# Patient Record
Sex: Female | Born: 1965 | Race: White | Hispanic: Yes | Marital: Married | Smoking: Never smoker
Health system: Southern US, Community
[De-identification: ages and names within clinical notes are randomized; demographics above are authoritative.]

## PROBLEM LIST (undated history)

## (undated) DIAGNOSIS — I10 Essential (primary) hypertension: Secondary | ICD-10-CM

## (undated) HISTORY — DX: Essential (primary) hypertension: I10

---

## 2001-05-20 ENCOUNTER — Inpatient Hospital Stay (HOSPITAL_COMMUNITY): Admission: AD | Admit: 2001-05-20 | Discharge: 2001-05-20 | Payer: Self-pay | Admitting: *Deleted

## 2001-05-24 ENCOUNTER — Encounter: Admission: RE | Admit: 2001-05-24 | Discharge: 2001-05-24 | Payer: Self-pay | Admitting: Obstetrics & Gynecology

## 2001-06-28 ENCOUNTER — Encounter: Admission: RE | Admit: 2001-06-28 | Discharge: 2001-06-28 | Payer: Self-pay | Admitting: Obstetrics & Gynecology

## 2002-04-06 ENCOUNTER — Encounter: Payer: Self-pay | Admitting: Internal Medicine

## 2002-04-06 ENCOUNTER — Ambulatory Visit (HOSPITAL_COMMUNITY): Admission: RE | Admit: 2002-04-06 | Discharge: 2002-04-06 | Payer: Self-pay | Admitting: Internal Medicine

## 2002-05-01 ENCOUNTER — Encounter: Payer: Self-pay | Admitting: Specialist

## 2002-05-01 ENCOUNTER — Encounter: Admission: RE | Admit: 2002-05-01 | Discharge: 2002-05-01 | Payer: Self-pay | Admitting: Specialist

## 2002-05-22 ENCOUNTER — Encounter (HOSPITAL_COMMUNITY): Admission: RE | Admit: 2002-05-22 | Discharge: 2002-08-20 | Payer: Self-pay | Admitting: Family Medicine

## 2003-08-22 ENCOUNTER — Ambulatory Visit (HOSPITAL_COMMUNITY): Admission: RE | Admit: 2003-08-22 | Discharge: 2003-08-22 | Payer: Self-pay | Admitting: *Deleted

## 2003-12-18 ENCOUNTER — Inpatient Hospital Stay (HOSPITAL_COMMUNITY): Admission: AD | Admit: 2003-12-18 | Discharge: 2003-12-20 | Payer: Self-pay | Admitting: *Deleted

## 2005-05-17 ENCOUNTER — Emergency Department (HOSPITAL_COMMUNITY): Admission: EM | Admit: 2005-05-17 | Discharge: 2005-05-17 | Payer: Self-pay | Admitting: Emergency Medicine

## 2008-05-01 ENCOUNTER — Ambulatory Visit: Payer: Self-pay | Admitting: Internal Medicine

## 2008-05-01 ENCOUNTER — Ambulatory Visit (HOSPITAL_COMMUNITY): Admission: RE | Admit: 2008-05-01 | Discharge: 2008-05-01 | Payer: Self-pay | Admitting: Internal Medicine

## 2008-05-01 LAB — CONVERTED CEMR LAB
ALT: 21 units/L (ref 0–35)
BUN: 14 mg/dL (ref 6–23)
CO2: 20 meq/L (ref 19–32)
Calcium: 8.4 mg/dL (ref 8.4–10.5)
Chloride: 105 meq/L (ref 96–112)
Cholesterol: 136 mg/dL (ref 0–200)
Creatinine, Ser: 0.55 mg/dL (ref 0.40–1.20)
Eosinophils Relative: 5 % (ref 0–5)
Glucose, Bld: 85 mg/dL (ref 70–99)
HCT: 27.6 % — ABNORMAL LOW (ref 36.0–46.0)
HDL: 45 mg/dL (ref >39)
Hemoglobin: 7.7 g/dL — CL (ref 12.0–15.0)
Lymphocytes Relative: 23 % (ref 12–46)
Lymphs Abs: 1.4 10*3/uL (ref 0.7–4.0)
Monocytes Absolute: 0.4 10*3/uL (ref 0.1–1.0)
Monocytes Relative: 6 % (ref 3–12)
Neutro Abs: 4 10*3/uL (ref 1.7–7.7)
RBC: 4.09 M/uL (ref 3.87–5.11)
Total Bilirubin: 0.4 mg/dL (ref 0.3–1.2)
Total CHOL/HDL Ratio: 3
Triglycerides: 140 mg/dL (ref <150)

## 2008-05-08 ENCOUNTER — Ambulatory Visit (HOSPITAL_COMMUNITY): Admission: RE | Admit: 2008-05-08 | Discharge: 2008-05-08 | Payer: Self-pay | Admitting: Family Medicine

## 2008-05-30 ENCOUNTER — Ambulatory Visit: Payer: Self-pay | Admitting: Internal Medicine

## 2008-05-30 ENCOUNTER — Ambulatory Visit: Payer: Self-pay | Admitting: *Deleted

## 2008-06-21 ENCOUNTER — Ambulatory Visit: Payer: Self-pay | Admitting: Internal Medicine

## 2008-06-21 ENCOUNTER — Encounter: Payer: Self-pay | Admitting: Internal Medicine

## 2008-06-21 LAB — CONVERTED CEMR LAB
Chlamydia, DNA Probe: NEGATIVE
GC Probe Amp, Genital: NEGATIVE

## 2008-07-05 ENCOUNTER — Ambulatory Visit: Payer: Self-pay | Admitting: Internal Medicine

## 2008-07-05 LAB — CONVERTED CEMR LAB
Basophils Absolute: 0 10*3/uL (ref 0.0–0.1)
Eosinophils Absolute: 0.2 10*3/uL (ref 0.0–0.7)
Eosinophils Relative: 3 % (ref 0–5)
HCT: 35.9 % — ABNORMAL LOW (ref 36.0–46.0)
Hemoglobin: 11.7 g/dL — ABNORMAL LOW (ref 12.0–15.0)
Lymphocytes Relative: 29 % (ref 12–46)
MCHC: 32.6 g/dL (ref 30.0–36.0)
Monocytes Absolute: 0.4 10*3/uL (ref 0.1–1.0)
RBC: 4.46 M/uL (ref 3.87–5.11)

## 2009-05-10 ENCOUNTER — Ambulatory Visit (HOSPITAL_COMMUNITY): Admission: RE | Admit: 2009-05-10 | Discharge: 2009-05-10 | Payer: Self-pay | Admitting: Internal Medicine

## 2012-03-22 ENCOUNTER — Other Ambulatory Visit (HOSPITAL_COMMUNITY): Payer: Self-pay | Admitting: Physician Assistant

## 2012-03-22 DIAGNOSIS — Z1231 Encounter for screening mammogram for malignant neoplasm of breast: Secondary | ICD-10-CM

## 2012-04-26 ENCOUNTER — Ambulatory Visit (HOSPITAL_COMMUNITY)
Admission: RE | Admit: 2012-04-26 | Discharge: 2012-04-26 | Disposition: A | Discharge status: Home / Self Care | Payer: Self-pay | Source: Ambulatory Visit | Attending: Physician Assistant | Admitting: Physician Assistant

## 2012-04-26 DIAGNOSIS — Z1231 Encounter for screening mammogram for malignant neoplasm of breast: Secondary | ICD-10-CM

## 2012-05-02 ENCOUNTER — Other Ambulatory Visit: Payer: Self-pay | Admitting: Physician Assistant

## 2012-05-02 DIAGNOSIS — R928 Other abnormal and inconclusive findings on diagnostic imaging of breast: Secondary | ICD-10-CM

## 2012-06-24 ENCOUNTER — Other Ambulatory Visit: Payer: Self-pay | Admitting: Family Medicine

## 2012-06-24 DIAGNOSIS — R928 Other abnormal and inconclusive findings on diagnostic imaging of breast: Secondary | ICD-10-CM

## 2012-06-27 ENCOUNTER — Ambulatory Visit
Admission: RE | Admit: 2012-06-27 | Discharge: 2012-06-27 | Disposition: A | Discharge status: Home / Self Care | Payer: No Typology Code available for payment source | Source: Ambulatory Visit | Attending: Physician Assistant | Admitting: Physician Assistant

## 2012-06-27 DIAGNOSIS — R928 Other abnormal and inconclusive findings on diagnostic imaging of breast: Secondary | ICD-10-CM

## 2013-04-05 ENCOUNTER — Other Ambulatory Visit: Payer: Self-pay | Admitting: Obstetrics and Gynecology

## 2013-04-05 DIAGNOSIS — Z1231 Encounter for screening mammogram for malignant neoplasm of breast: Secondary | ICD-10-CM

## 2013-05-01 ENCOUNTER — Ambulatory Visit (HOSPITAL_COMMUNITY)
Admission: RE | Admit: 2013-05-01 | Discharge: 2013-05-01 | Disposition: A | Discharge status: Home / Self Care | Payer: Self-pay | Source: Ambulatory Visit | Attending: Obstetrics and Gynecology | Admitting: Obstetrics and Gynecology

## 2013-05-01 DIAGNOSIS — Z1231 Encounter for screening mammogram for malignant neoplasm of breast: Secondary | ICD-10-CM | POA: Insufficient documentation

## 2013-10-19 ENCOUNTER — Telehealth (HOSPITAL_COMMUNITY): Payer: Self-pay | Admitting: *Deleted

## 2013-10-19 NOTE — Telephone Encounter (Signed)
Telephoned patient at home #. Patient states no longer having breast pain. Advised patient the importance of doing monthly breast exams and if notice any lumps or changes to call. Also needs to avoid any caffeine because this can cause breast pain. Patient voiced understanding. Used interpreter Delorise Royals.

## 2014-04-12 ENCOUNTER — Other Ambulatory Visit (HOSPITAL_COMMUNITY): Payer: Self-pay | Admitting: Nurse Practitioner

## 2014-04-12 DIAGNOSIS — Z1231 Encounter for screening mammogram for malignant neoplasm of breast: Secondary | ICD-10-CM

## 2014-05-02 ENCOUNTER — Ambulatory Visit (HOSPITAL_COMMUNITY): Payer: No Typology Code available for payment source

## 2014-05-02 ENCOUNTER — Ambulatory Visit (HOSPITAL_COMMUNITY)
Admission: RE | Admit: 2014-05-02 | Discharge: 2014-05-02 | Disposition: A | Discharge status: Home / Self Care | Payer: No Typology Code available for payment source | Source: Ambulatory Visit | Attending: Nurse Practitioner | Admitting: Nurse Practitioner

## 2014-05-02 DIAGNOSIS — Z1231 Encounter for screening mammogram for malignant neoplasm of breast: Secondary | ICD-10-CM

## 2014-05-30 ENCOUNTER — Encounter: Payer: Self-pay | Admitting: Obstetrics & Gynecology

## 2014-06-28 ENCOUNTER — Encounter: Payer: Self-pay | Admitting: *Deleted

## 2014-07-05 ENCOUNTER — Encounter: Payer: No Typology Code available for payment source | Admitting: Obstetrics & Gynecology

## 2014-07-30 ENCOUNTER — Ambulatory Visit (INDEPENDENT_AMBULATORY_CARE_PROVIDER_SITE_OTHER): Payer: Self-pay | Admitting: Obstetrics & Gynecology

## 2014-07-30 ENCOUNTER — Encounter: Payer: Self-pay | Admitting: Obstetrics & Gynecology

## 2014-07-30 VITALS — BP 120/71 | HR 83 | Temp 98.8°F | Ht <= 58 in | Wt 129.2 lb

## 2014-07-30 DIAGNOSIS — N912 Amenorrhea, unspecified: Secondary | ICD-10-CM

## 2014-07-30 NOTE — Patient Instructions (Signed)
Menopausia y productos a base de hierbas  (Menopause and Herbal Products) La menopausia es el perodo normal de la vida en el que los perodos menstruales cesan por completo. Se completa cuando no se tienen 12 perodos menstruales consecutivos. Por lo general ocurre [employer] 48 a 55 aos, con una edad media de [place]. Muy rara vez una mujer tiene la menopausia antes de los 40 [place]. En la menopausia, los ovarios dejan de producir hormonas femeninas, estrgenos y Education officer, museum. Esto puede causar sntomas indeseables y Sport and exercise psychologist a Radiographer, therapeutic. A veces los sntomas pueden ocurrir de 4 a 5 aos antes de que [employer]. No existe una relacin Parker Hann[employer]in y:   [place] anticonceptivos orales.  La cantidad de hijos que tuvo.  La raza.  La edad en que comenzaron los perodos menstruales (menarca). Las mujeres que fuman mucho y las que son muy delgadas pueden desarrollar la menopausia en edades menores.  El tratamiento hormonal con estrgenos y progesterona es el mtodo usual de tratamiento para los sntomas menopusicos. Sin embargo, hay mujeres que no deben recibir Scientist, research (medical) hormonal. Los casos son:   Las mujeres que sufren cncer de mama o de tero.  Las que prefieren no tomar hormonas debido a Neurosurgeon secundarios (sangrado uterino anormal).  Las que tienen miedo de que las hormonas puedan causarle cncer de mama.  Las mujeres que tienen antecedentes de enfermedad heptica, enfermedad cardaca, ictus o cogulos de sangre. Para estas mujeres, hay otros medicamentos que pueden ayudar a tratar los sntomas de la [place]. Estos medicamentos se encuentran en las plantas y en los productos botnicos. Se pueden encontrar en forma de hierbas, ts, aceites, tinturas, y pldoras.  CAUSAS   Los ovarios dejan de producir las hormonas femeninas, estrgenos y Education officer, museum.  Otras causas son:  Kandis Ban para extirpar ambos ovarios.  Los ovarios que dejan de funcionar sin un  motivo conocido.  Tumores de la glndula pituitaria en el cerebro.  Enfermedades que [employer] ovarios y la produccin de hormonas.  La radioterapia en el abdomen o la pelvis.  La quimioterapia que afecte a los ovarios. FITOESTRGENOS Los fitoestrgenos se encuentran de forma natural en las plantas y en los productos vegetales. Actan como el estrgeno en el cuerpo. Los medicamentos de hierbas estn hechos de estas plantas y de esteroides botnicos. Hay 3 tipos de fitoestrgenos:   Las isoflavonas (genistena y daidzena) se encuentran en los alimentos que contienen soja, garbanzos, miso y tofu.  Los ligins se encuentran en las cscaras de las semillas. Se utilizan para hacer aceites como el aceite de linaza. Las bacterias en el intestino actan sobre estos alimentos para producir el estrgeno, como las hormonas.  El coumestan se asemeja a los estrgenos. Algunos de los alimentos se encuentran en las semillas de [place]aken[place] y los brotes de soja. TRASTORNOS Y SU POSIBLE TRATAMIENTO CON HIERBAS  Sofocones y sudores nocturnos.  Reinaldo Berber, cohosh negro y [place].  Irritabilidad, insomnio, depresin y [address] de [place].  [place], ginseng y soja.  La hierba de [place] puede ser til para la depresin. Sin embargo, Catering manager preocupacin de que produzca cataratas en los ojos y puede tener efectos negativos en otros medicamentos. La hierba de [place]. [place] no debe tomarse durante mucho tiempo y sin la aprobacin de su mdico.  La prdida de la libido y la sequedad vaginal y de la piel.  ame silvestre y soja.  La prevencin de la enfermedad cardaca coronaria y de la osteoporosis.  Reinaldo Berber  e isoflavonas. Varios estudios han demostrado que algunas mujeres se benefician con los medicamentos a base de hierbas, pero la mayora de los estudios no han demostrado que estos suplementos sean mucho mejores que el placebo. Otras formas de tratamiento para ayudar a las mujeres con sntomas de menopausia son  Marnee Guarneriuna dieta equilibrada, descanso, ejercicio, vitaminas y suplemento de calcio Biomedical engineer(con vitamina D), acupuntura y terapia de grupo cuando sea necesario.  QUIENES NO DEBEN TOMAR MEDICAMENTOS DE HIERBAS:   Las mujeres que estn pensando en quedar embarazadas a menos que su mdico le indique.  Las mujeres que estn amamantando a menos que su mdico le indique.  Las mujeres que estn tomando otros medicamentos recetados a menos que su mdico le indique.  Los bebs, los nias y las mujeres mayores a menos que su mdico le indique. Los diferentes medicamentos herbales tienen cantidades diferentes y no medidas de sus ingredientes. No existen normas, control de calidad y [address] de los Energy East Corporationingredientes en los medicamentos a base de hierbas. Por lo tanto, la cantidad del ingrediente de la medicacin puede variar de una hierba, [place], t, aceite o tintura a otro. Muchos medicamentos herbales pueden causar problemas graves e incluso pueden tener efectos txicos si se toman en exceso o por un tiempo prolongado. Si surgen problemas, el medicamento debe ser retenido y registrado por su mdico.  INSTRUCCIONES PARA EL CUIDADO EN EL HOGAR   No tome ni de a las nias medicamentos a base de hierbas sin consultar a su mdico.  Informe al profesional todos los medicamentos que toma. Esto incluye medicamentos recetados, de [address] libre, gotas para los ojos y Control and instrumentation engineercremas.  No tome medicamentos a base de hierbas por mucho tiempo o ms de lo recomendado.  Informe a su mdico acerca de cualquier efecto secundario de la medicacin. SOLICITE ATENCIN MDICA SI:   Tiene fiebre de 38,9 C (102 F), o segn como indique su mdico.  Tiene malestar estomacal (nuseas), vmitos, o tiene diarrea.  Aparece una erupcin cutnea.  Siente dolor abdominal.  Sufre un dolor de cabeza intenso.  Comienza a tener problemas de visin.  Se siente mareada o sufre un desmayo.  Empieza a sentir entumecimiento en cualquier parte de su  cuerpo.  Empieza a agitarse (convulsiones). Document Released: 08/31/2012 [facility] Patient Information 2015 [place]ExitCare, [place]. This information is not intended to replace advice given to you by your health care provider. Make sure you discuss any questions you have with your health care provider. La menopausia (Menopause) La menopausia es el perodo normal de la vida en el cual los perodos menstruales cesan por completo. Se completa cuando no se tienen 12 perodos menstruales consecutivos. Ocurre generalmente en mujeres entre los 40 y los 55 aos. Muy rara vez una mujer tiene la menopausia antes de los 40 [place]. En la menopausia, los ovarios dejan de producir las hormonas femeninas estrgeno y Education officer, museumprogesterona. Esto puede causar sntomas indeseables y Sport and exercise psychologisttambin afectar a Radiographer, therapeuticla salud. A veces los sntomas aparecen entre 4 y 5 aos antes de que comience la menopausia. No existe una relacin Parker Hannifinentre la menopausia y:  [place] anticonceptivos orales.  La cantidad de hijos que tuvo.  La raza.  La edad en que comenzaron los perodos menstruales (menarca). Las mujeres que fuman mucho y las que son muy delgadas pueden desarrollar la menopausia a una edad ms temprana. CAUSAS  Los ovarios dejan de producir las hormonas femeninas estrgeno y Education officer, museumprogesterona.  Otras causas son:  [place] en la que se extirpan ambos ovarios.  Los ovarios  que dejan de funcionar sin un motivo conocido.  Tumores de la glndula pituitaria en el cerebro.  Enfermedades que [employer] ovarios y la produccin de hormonas.  Radioterapia en el abdomen o la pelvis.  Quimioterapia que afecte a los ovarios. SNTOMAS   Acaloramiento.  Sudoracin nocturna.  Disminucin del deseo sexual.  Sequedad vaginal y adelgazamiento de la vagina, lo que causa relaciones sexuales dolorosas.  Sequedad de la piel y aparicin de Banker.  Dolores de [place].  Cansancio.  Irritabilidad.  Problemas de memoria.  Aumento de [place].  Infeccin de  la vejiga.  Aparicin de vello en el rostro y [place].  Infertilidad. Los sntomas ms graves pueden incluir:  Prdida de masa sea osteoporosis) que favorece la rotura de huesos(fracturas).  Depresin.  Endurecimiento y Scientist, research (medical) de las arterias (aterosclerosis) lo que puede causar infarto e ictus. DIAGNSTICO   El perodo menstrual no aparece por 12 meses seguidos.  Examen fsico.  Estudios hormonales de Risk manager. TRATAMIENTO  Hay muchas opciones de tratamiento y casi tantas dudas como opciones existen. La decisin de tratar o no los cambios que trae la menopausia es una decisin que realiza el profesional de acuerdo con cada Marketing executive. El Animal nutritionist las opciones de tratamiento con usted. Juntos podrn decidir qu tratamiento es el mejor para usted. Las opciones de tratamiento pueden incluir:   Terapia hormonal (estrgenos y Education officer, museum).  Medicamentos no hormonales.  Tratamiento de los sntomas individuales con medicamentos (por ejemplo antidepresivos para la depresin).  Algunos medicamentos herbales pueden ayudar en sntomas especficos.  Psicoterapia con un psiclogo o psiquiatra.  Terapia grupal.  Cambios en el estilo de vida, como:  Consumir una dieta saludable.  Actividad fsica regular.  Limitar el consumo de cafena y alcohol.  Control del estrs y Landscape architect.  No realizar tratamiento. INSTRUCCIONES PARA EL CUIDADO EN EL HOGAR   Tome todos los medicamentos como el mdico le indique.  Descanse y duerma lo suficiente.  Haga ejercicios regularmente.  Consuma una dieta que contenga calcio (bueno para los [place]) y productos derivados de la soja (actan como estrgenos).  Evite las bebidas alcohlicas.  No fume.  Si tiene sofocones, vstase en capas.  Tome suplementos de calcio y vitamina D para fortalecer los [place].  Puede utilizar lubricantes y humectantes de venta libre para la sequedad vaginal.  En algunos  casos la terapia de grupo podr ayudarla.  La acupuntura puede ser de ayuda en ciertos casos. SOLICITE ATENCIN MDICA SI:   No est segura de estar en la menopausia.  Tiene sntomas de [place] y [place] asesoramiento y TEFL teacher.  Tiene 57 aos y an tiene Environmental manager.  Tiene dolor durante las [employer].  La menopausia se ha completado (no ha tenido el perodo menstrual durante 12 meses) y tiene un sangrado vaginal.  Necesita ser derivada a un especialista (gineclogo, psiquiatra, o psiclogo) para Pensions consultant. SOLICITE ATENCIN MDICA DE INMEDIATO SI:   Siente una depresin intensa e incontrolable.  Tiene una hemorragia vaginal abundante.  Siente y cree que se ha fracturado un hueso.  Siente dolor al [employer].  Siente dolor en el pecho o en la pierna.  Tiene latidos cardacos rpidos (palpitaciones).  Siente dolor de cabeza intenso.  Tiene problemas de visin.  Siente un bulto en la mama.  Siente un dolor abdominal intenso o indigestin. Document Released: 01/18/2007 Document Revised: 08/09/2013 [facility] Patient Information 2015 [place], [place]. This information is not intended to replace advice given to you by your health care  provider. Make sure you discuss any questions you have with your health care provider.

## 2014-07-30 NOTE — Progress Notes (Signed)
Subjective:     Patient ID: Lynn [name], female   DOB: 03-Dec-1966, 48 y.o.   MRN: [id number]  HPI Pt is a 48 yo Hispanic female reports bleeding Jul 24-28.  Last menses prior to that Sept 2014. She has had hot flushes.  She reports that she has been going through this for 3 years.  She is not concerned and feels does not understand why she was referred.     Past Medical History  Diagnosis Date  . Hypertension   History reviewed. No pertinent past surgical history.  No current outpatient prescriptions on file prior to visit.   No current facility-administered medications on file prior to visit.  No Known Allergies History   Social History  . Marital Status: Married    Spouse Name: N/A    Number of Children: N/A  . Years of Education: N/A   Occupational History  . Not on file.   Social History Main Topics  . Smoking status: Never Smoker   . Smokeless tobacco: Never Used  . Alcohol Use: No  . Drug Use: No  . Sexual Activity: Yes    Birth Control/ Protection: Condom   Other Topics Concern  . Not on file   Social History Narrative  . No narrative on file      Review of Systems     Objective:   Physical Exam BP 120/71  Pulse 83  Temp(Src) 98.8 F (37.1 C)  Ht 4' 9.5" (1.461 m)  Wt 129 lb 3.2 oz (58.605 kg)  BMI 27.46 kg/m2  LMP 07/13/2014  Exam not indicated     Assessment:     perimenopause     Plan:     Pt educated on menopause and perimenopause Pt offered screening PAP with grant. She will call

## 2015-03-05 ENCOUNTER — Encounter (HOSPITAL_COMMUNITY): Payer: Self-pay | Admitting: Emergency Medicine

## 2015-03-05 ENCOUNTER — Emergency Department (HOSPITAL_COMMUNITY)
Admission: EM | Admit: 2015-03-05 | Discharge: 2015-03-05 | Disposition: A | Discharge status: Home / Self Care | Payer: Self-pay | Attending: Emergency Medicine | Admitting: Emergency Medicine

## 2015-03-05 ENCOUNTER — Emergency Department (HOSPITAL_COMMUNITY): Payer: Self-pay

## 2015-03-05 DIAGNOSIS — I10 Essential (primary) hypertension: Secondary | ICD-10-CM | POA: Insufficient documentation

## 2015-03-05 DIAGNOSIS — M79641 Pain in right hand: Secondary | ICD-10-CM | POA: Insufficient documentation

## 2015-03-05 MED ORDER — HYDROCODONE-ACETAMINOPHEN 5-325 MG PO TABS: 1.0000 | ORAL_TABLET | ORAL | Status: DC | PRN

## 2015-03-05 MED ORDER — OXYCODONE-ACETAMINOPHEN 5-325 MG PO TABS
1.0000 | ORAL_TABLET | Freq: Once | ORAL | Status: AC
  Administered 2015-03-05: 1 via ORAL
  Filled 2015-03-05: qty 1

## 2015-03-05 MED ORDER — IBUPROFEN 800 MG PO TABS: 800.0000 mg | ORAL_TABLET | Freq: Three times a day (TID) | ORAL | Status: DC | PRN

## 2015-03-05 NOTE — Discharge Instructions (Signed)
Read the information below.  Use the prescribed medication as directed.  Please discuss all new medications with your pharmacist.  Do not take additional tylenol while taking the prescribed pain medication to avoid overdose.  You may return to the Emergency Department at any time for worsening condition or any new symptoms that concern you.  If you develop uncontrolled pain, weakness or numbness of the extremity, severe discoloration of the skin, or you are unable to move your hand, return to the ER for a recheck.     Lea la siguiente informacin . Usar el medicamento recetado como se indica. Por favor discutir todos los medicamentos nuevos con su farmacutico . No tome Tylenol adicional mientras est tomando el medicamento para el dolor prescrita para evitar sobredosis. Usted puede volver a la sala de urgencias en cualquier momento por cualquier condicin o nuevos sntomas que le preocupan empeoramiento . Si se presenta un dolor incontrolado , debilidad o entumecimiento de las extremidades , alteraciones significativas del color de la piel , o si no puede mover la mano , volver a la sala de Database administratoremergencias de volver a Ecologistexaminar .

## 2015-03-05 NOTE — ED Notes (Signed)
Patient reports when she woke up yesterday morning her R hand was hurting, primarily on the ulnar side. Reports as the day progressed the pain spread to her entire hand and up to her forearm. Pt has limited ROM in affected hand due to pain. Patient denies numbness, neurologically intact. Pt denies any specific injury. Ax4, NAD.

## 2015-03-05 NOTE — ED Provider Notes (Signed)
CSN: [id number]     Arrival date & time 03/05/15  [id number] History  This chart was scribed for Trixie DredgeEmily Alaney Witter, PA-C with Shon Batonourtney F Horton, MD by Tonye RoyaltyJoshua Chen, ED Scribe. This patient was seen in room TR08C/TR08C and the patient's care was started at 9:29 AM.    Chief Complaint  Patient presents with  . Arm Pain   HPI HPI Comments: Lynn [name] is a 49 y.o. female who presents to the Emergency Department complaining of right hand pain with onset yesterday morning. She states it was initially located to area near 5th finger; she tried to massage it without improvement, then pain spread throughout her hand. She states she was unable to move it last night, and states pain continued throughout the night. She descibres pain as sharp and rates it at 10/10 at worst and 8/10 now after medication received here. She denies any injury. Patient is right hand dominant. She is not working right now but does a lot of house work with her right hand. She denies numbness or tingling but states it feels swollen. She notes URI with body aches last week that has resolved. She also has seasonal allergies. She denies neck pain or weakness in her arm.   Past Medical History  Diagnosis Date  . Hypertension    History reviewed. No pertinent past surgical history. No family history on file. History  Substance Use Topics  . Smoking status: Never Smoker   . Smokeless tobacco: Never Used  . Alcohol Use: No   OB History    No data available     Review of Systems  Constitutional: Negative for fever and chills.  Musculoskeletal: Positive for arthralgias. Negative for neck pain.       Pain to right hand  Skin: Negative for color change, pallor, rash and wound.  Allergic/Immunologic: Positive for environmental allergies. Negative for immunocompromised state.  Neurological: Negative for weakness and numbness.  Psychiatric/Behavioral: Negative for self-injury.      Allergies  Review of patient's allergies  indicates no known allergies.  Home Medications   Prior to Admission medications   Not on File   BP 125/77 mmHg  Pulse 86  Temp(Src) 98 F (36.7 C) (Oral)  Resp 22  SpO2 99% Physical Exam  Constitutional: She appears well-developed and well-nourished. No distress.  HENT:  Head: Normocephalic and atraumatic.  Neck: Neck supple.  Pulmonary/Chest: Effort normal.  Musculoskeletal:  Tender in 5th MCP joint No able to fully extend the 5th digit Pain with any passive ROM of that joint Isolation of the PIP and DIP on 5th finger are normal No erythema, edema, or warmth associated with the joints  Neurological: She is alert.  Skin: She is not diaphoretic.  Nursing note and vitals reviewed.   ED Course  Procedures (including critical care time)  DIAGNOSTIC STUDIES: Oxygen Saturation is 99% on room air, normal by my interpretation.    COORDINATION OF CARE: 9:38 AM Discussed treatment plan with patient at beside, including x-ray. The patient agrees with the plan and has no further questions at this time.  Labs Review Labs Reviewed - No data to display  Imaging Review No results found.   EKG Interpretation None      MDM   Final diagnoses:  Pain of right hand    Afebrile, nontoxic patient with injury to her right 5th MCP without injury.   Xray negative.  No clinical e/o septic joint or gout.  Discussed pt with Dr Wilkie AyeHorton.   D/C  home with pain medication,  [facility] referral for follow up.  Discussed result, findings, treatment, and follow up  with patient.  Pt given return precautions.  Pt verbalizes understanding and agrees with plan.      I personally performed the services described in this documentation, which was scribed in my presence. The recorded information has been reviewed and is accurate.   Trixie Dredge, PA-C 03/05/15 1620  Shon Baton, MD 03/08/15 [phone]

## 2015-11-22 IMAGING — DX DG HAND COMPLETE 3+V*R*
3 series · 3 of 3 positions shown · non-contrast
Comparison: None.

CLINICAL DATA: Fifth metacarpal pain.  No known injury

EXAM:
RIGHT HAND - COMPLETE 3+ VIEW

[hand pa]
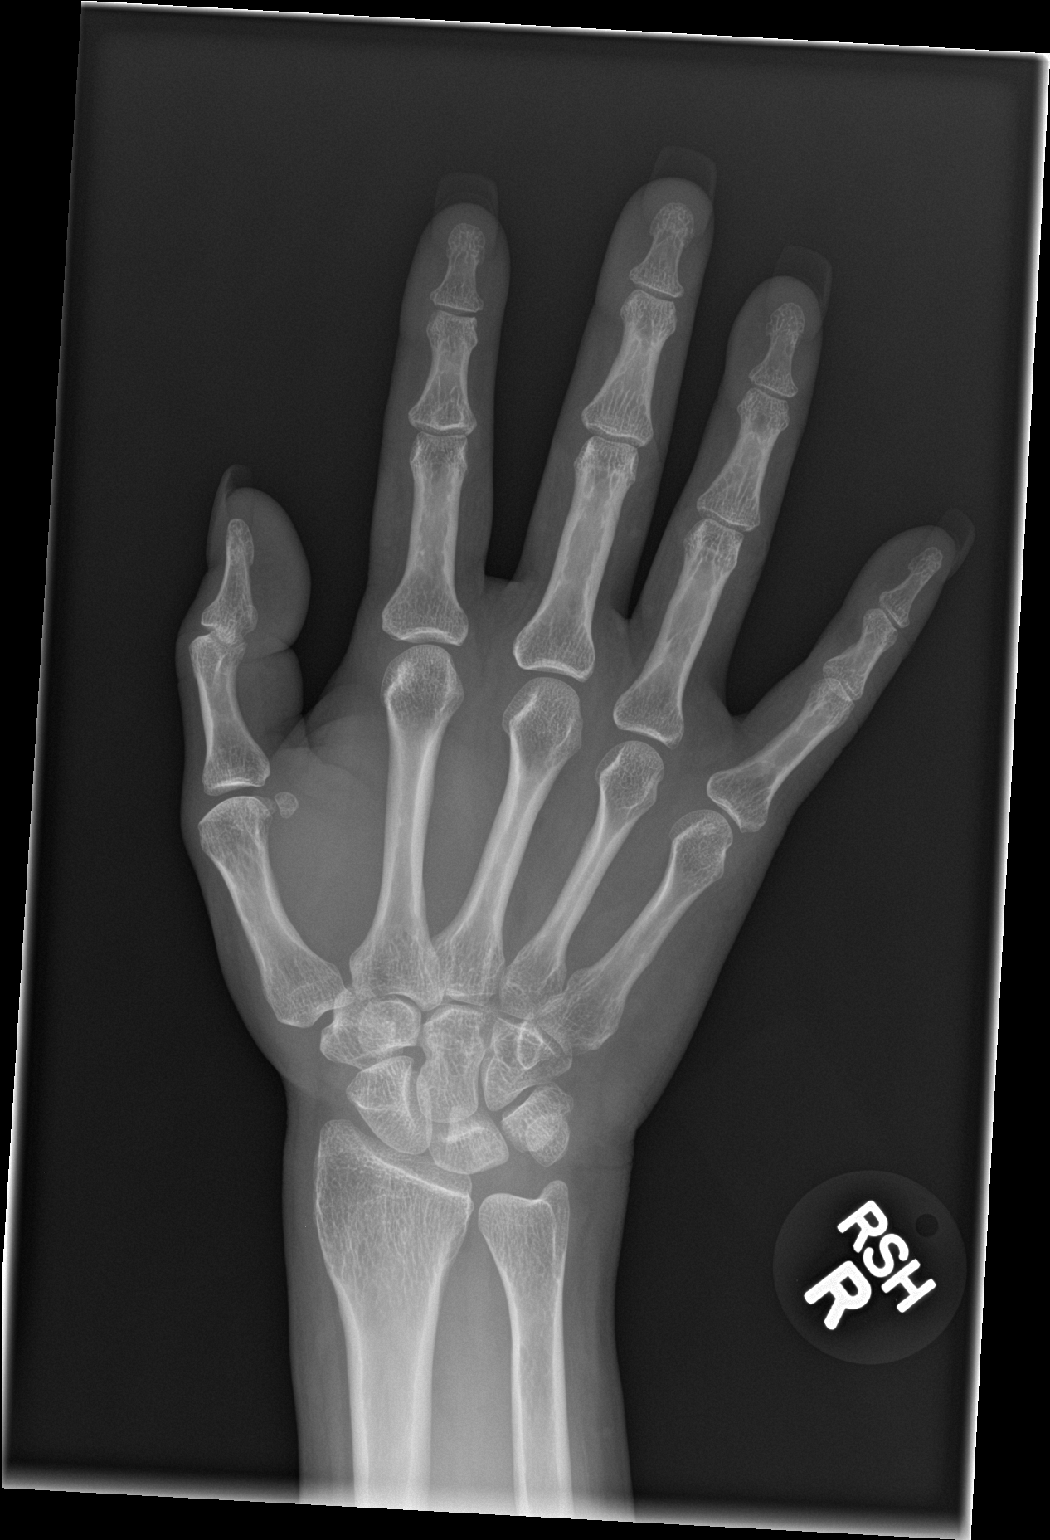

[hand obl]
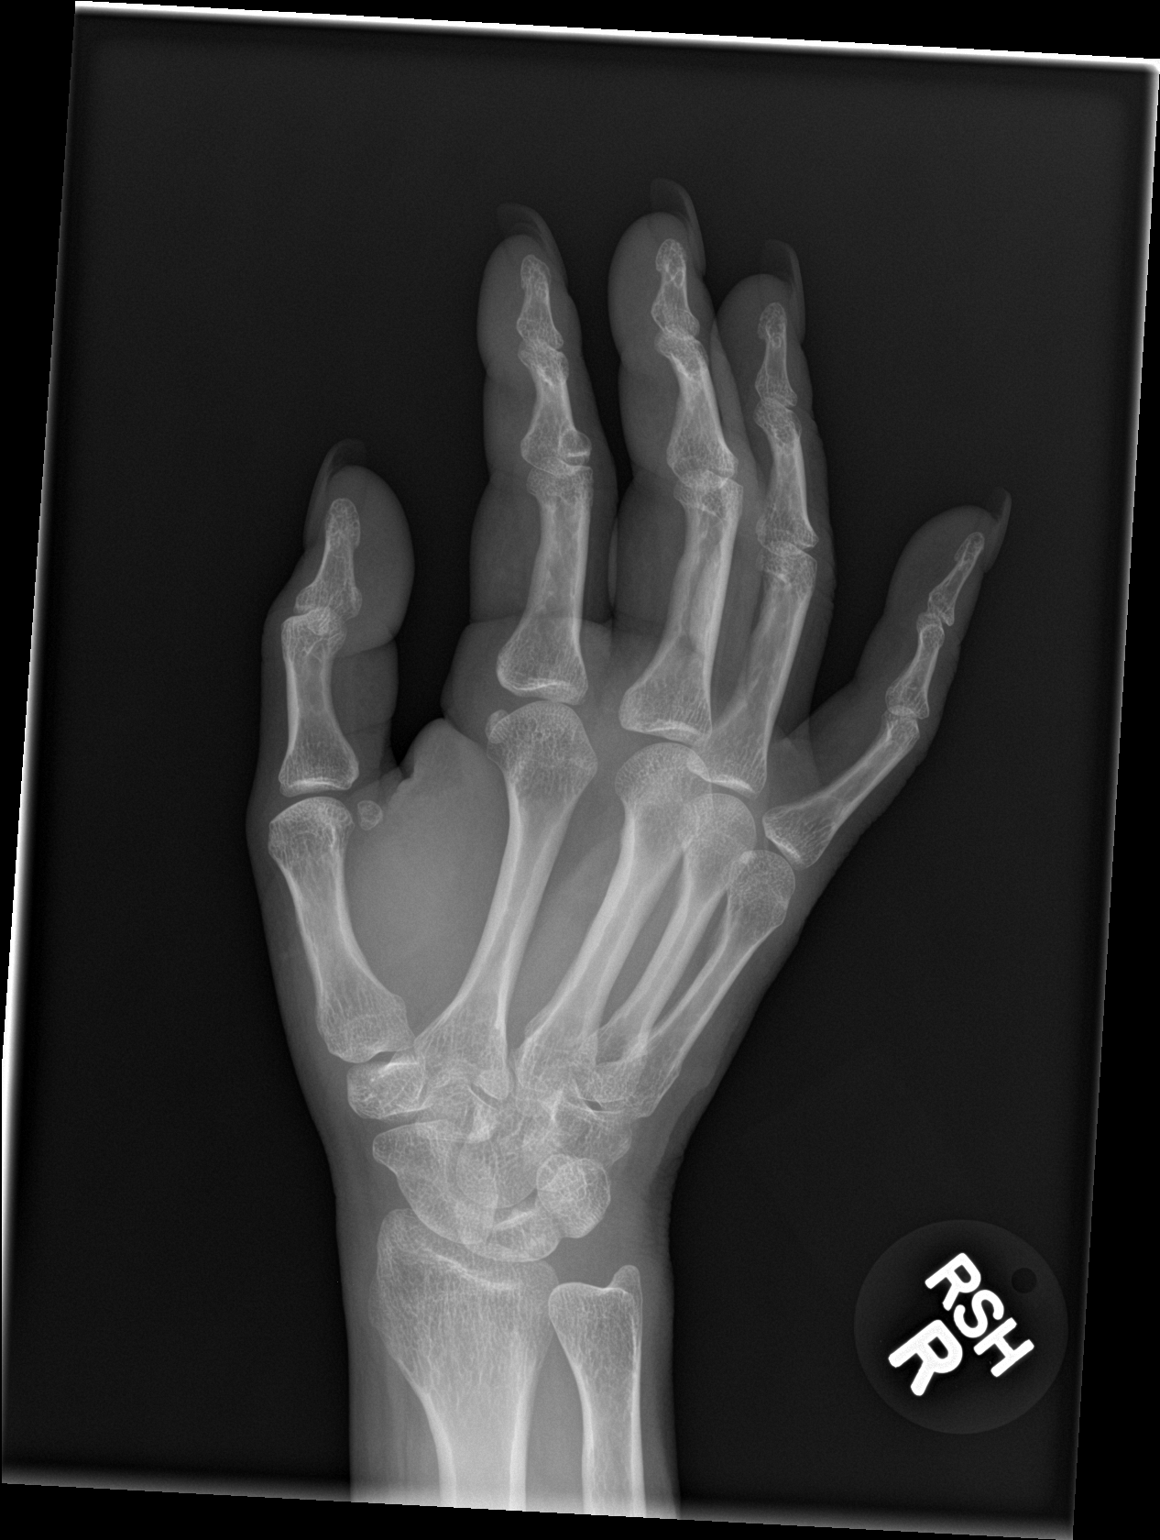

[hand lat]
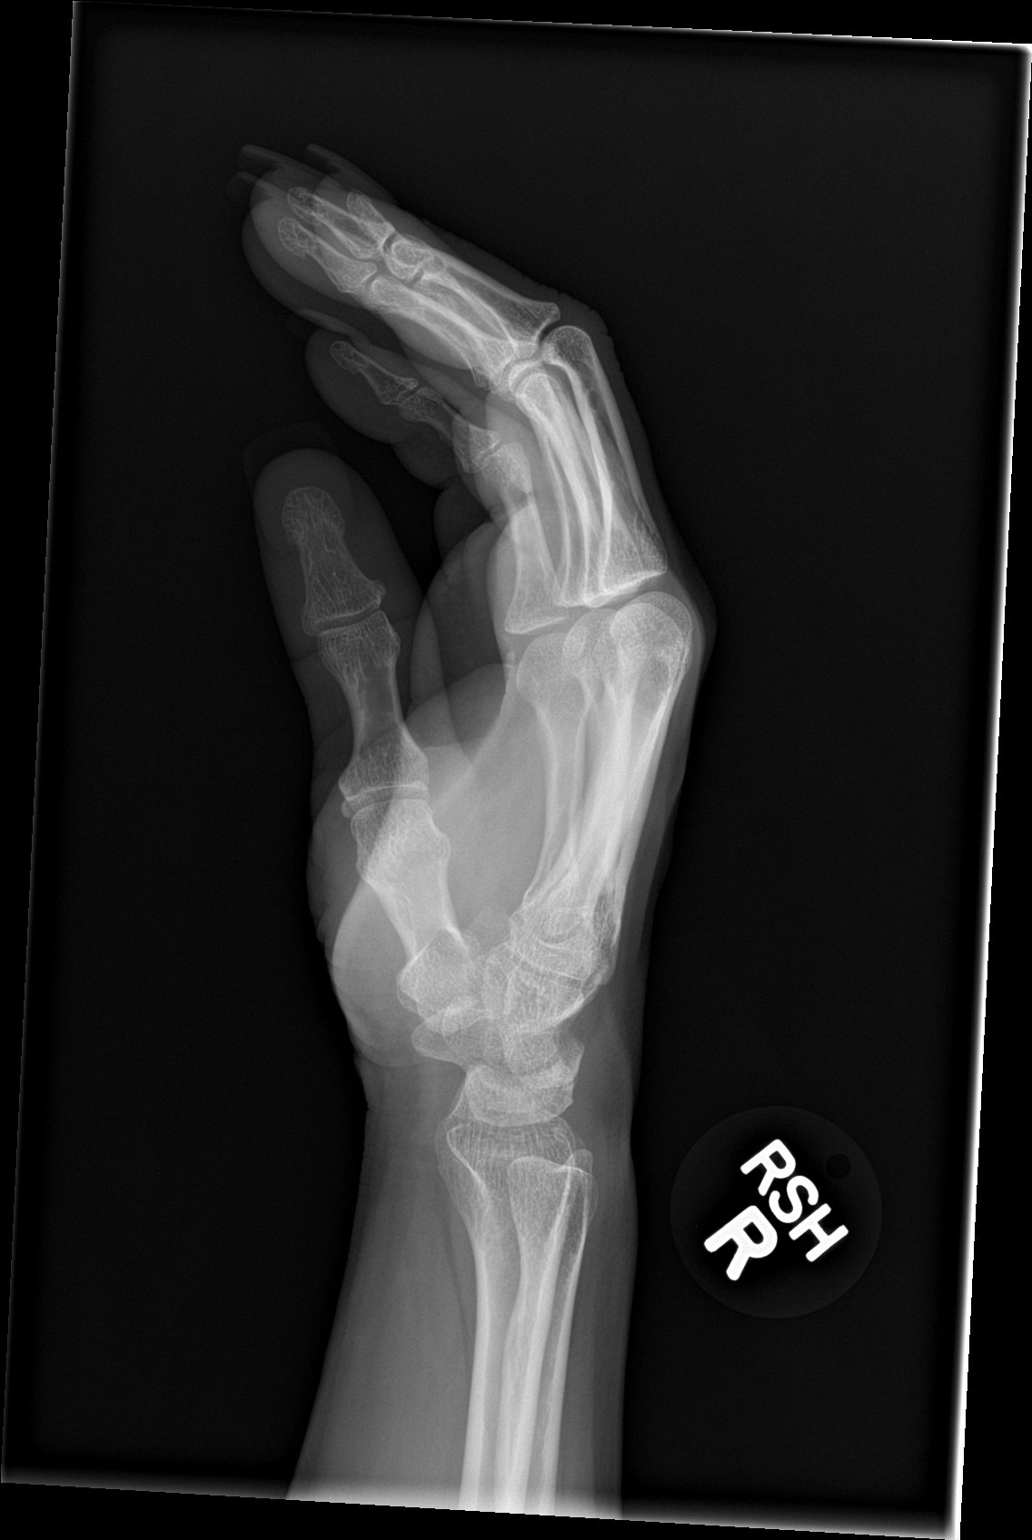

[3 of 3 positions shown; findings below may reference images not displayed]

FINDINGS: There is no evidence of fracture or dislocation. There is no
evidence of arthropathy or other focal bone abnormality. Soft
tissues are unremarkable.
IMPRESSION: Negative.

## 2016-01-20 ENCOUNTER — Emergency Department (HOSPITAL_COMMUNITY)
Admission: EM | Admit: 2016-01-20 | Discharge: 2016-01-20 | Disposition: A | Discharge status: Home / Self Care | Payer: Self-pay | Attending: Emergency Medicine | Admitting: Emergency Medicine

## 2016-01-20 ENCOUNTER — Encounter (HOSPITAL_COMMUNITY): Payer: Self-pay | Admitting: *Deleted

## 2016-01-20 DIAGNOSIS — I10 Essential (primary) hypertension: Secondary | ICD-10-CM | POA: Insufficient documentation

## 2016-01-20 DIAGNOSIS — M79651 Pain in right thigh: Secondary | ICD-10-CM | POA: Insufficient documentation

## 2016-01-20 DIAGNOSIS — M79604 Pain in right leg: Secondary | ICD-10-CM

## 2016-01-20 MED ORDER — METHOCARBAMOL 500 MG PO TABS: 500.0000 mg | ORAL_TABLET | Freq: Two times a day (BID) | ORAL | Status: DC

## 2016-01-20 MED ORDER — KETOROLAC TROMETHAMINE 60 MG/2ML IM SOLN
60.0000 mg | Freq: Once | INTRAMUSCULAR | Status: AC
  Administered 2016-01-20: 60 mg via INTRAMUSCULAR
  Filled 2016-01-20: qty 2

## 2016-01-20 MED ORDER — IBUPROFEN 800 MG PO TABS: 800.0000 mg | ORAL_TABLET | Freq: Three times a day (TID) | ORAL | Status: DC

## 2016-01-20 MED ORDER — METHOCARBAMOL 500 MG PO TABS
500.0000 mg | ORAL_TABLET | Freq: Once | ORAL | Status: AC
Start: 2016-01-20 — End: 2016-01-20
  Administered 2016-01-20: 500 mg via ORAL
  Filled 2016-01-20: qty 1

## 2016-01-20 NOTE — ED Provider Notes (Signed)
CSN: [id number]     Arrival date & time 01/20/16  1623 History  By signing my name below, I, Lynn [name], attest that this documentation has been prepared under the direction and in the presence of Lynn Heimlich, PA-C Electronically Signed: Soijett [name], ED Scribe. 01/20/2016. 5:40 PM.   Chief Complaint  Patient presents with  . Leg Pain   The history is provided by the patient. No language interpreter was used.   Lynn [name] is a 50 y.o. female with a medical hx of HTN who presents to the Emergency Department complaining of constant, throbbing, right lateral thigh pain onset 3 days. Pt denies any recent fall/injury/trauma to the area prior to the onset of her symptoms. She reports that there are no alleviating factors. She states that stretching and ambulation worsen her right lateral thigh pain. Pt stands on her feet a lot for her occupation and thinks this may have exacerbated her pain. Pt is having associated symptoms of gait problem due to pain. She arrived to ED by POV and was able to ambulate from car to waiting room and then to treatment room. She notes that she has tried icy hot with mild relief of her symptoms. She has not tried any OTC medications. She denies color change, wound, rash, numbness, tingling, weakness, and any other symptoms. Pt denies having a PCP at this time.  Past Medical History  Diagnosis Date  . Hypertension    History reviewed. No pertinent past surgical history. No family history on file. Social History  Substance Use Topics  . Smoking status: Never Smoker   . Smokeless tobacco: Never Used  . Alcohol Use: No   OB History    No data available     Review of Systems  Musculoskeletal: Positive for arthralgias (right thigh) and gait problem (due to pain).  Skin: Negative for color change, rash and wound.  Neurological: Negative for weakness and numbness.       No tingling  All other systems reviewed and are negative.     Allergies  Review  of patient's allergies indicates no known allergies.  Home Medications   Prior to Admission medications   Medication Sig Start Date End Date Taking? Authorizing Provider  HYDROcodone-acetaminophen (NORCO/VICODIN) 5-325 MG per tablet Take 1 tablet by mouth every 4 (four) hours as needed for moderate pain or severe pain. 03/05/15   Trixie Dredge, PA-C  ibuprofen (ADVIL,MOTRIN) 800 MG tablet Take 1 tablet (800 mg total) by mouth every 8 (eight) hours as needed for mild pain or moderate pain. 03/05/15   Trixie Dredge, PA-C  ibuprofen (ADVIL,MOTRIN) 800 MG tablet Take 1 tablet (800 mg total) by mouth 3 (three) times daily. 01/20/16   Gwen Sarvis, PA-C  methocarbamol (ROBAXIN) 500 MG tablet Take 1 tablet (500 mg total) by mouth 2 (two) times daily. 01/20/16   Johnney Scarlata, PA-C   BP 128/79 mmHg  Pulse 72  Temp(Src) 98.9 F (37.2 C) (Oral)  Resp 18  SpO2 99% Physical Exam  Physical Exam  Constitutional: Pt appears well-developed and well-nourished. No distress.  HENT:  Head: Normocephalic and atraumatic.  Eyes: Conjunctivae are normal.  Neck: Normal range of motion.  Cardiovascular: Normal rate, regular rhythm and intact distal pulses.   Capillary refill < 3 sec  Pulmonary/Chest: Effort normal and breath sounds normal.  Musculoskeletal: Pt exhibits tenderness. Pt exhibits no edema.  ROM of the right lower extremity fully intact. Pt has tenderness of the musculature or the right lateral thigh. No  bony or joint tenderness. There is no edema or obvious deformity. Pt walks favoring the right leg. After receiving toradol and robaxin, pt is able to ambulate with a steady gait and reports improved pain.  Neurological: Pt  is alert. Coordination normal.  Sensation to light touch intact throughout.  Strength 5/5 of bilateral lower extremities though with pain on testing knee strength of the right leg  Skin: Skin is warm and dry. Pt is not diaphoretic.  No erythema, induration, fluctuance, vesicles,  papules, bullae, desquamation, duskiness or any other skin changes noted over the right lateral thigh.  Psychiatric: Pt has a normal mood and affect.  Nursing note and vitals reviewed.   ED Course  Procedures (including critical care time) DIAGNOSTIC STUDIES: Oxygen Saturation is 99% on RA, nl by my interpretation.    COORDINATION OF CARE: 5:40 PM Discussed treatment plan with pt at bedside which includes toradol injection and referral to [facility], and pt agreed to plan.  Labs Review Labs Reviewed - No data to display  Imaging Review No results found.    EKG Interpretation None      MDM   Final diagnoses:  Right leg pain   Patient presenting with right thigh pain x 1 day. Right leg is neurovascularly intact with FROM. Tenderness over musculature of the lateral thigh. No overlying skin changes. Pain managed in ED with toradol and robaxin. Pt is able to ambulate with a steady gait. Conservative therapy recommended and pt discharged with short course of robaxin. Discussed RICE therapy and use of OTC pain relievers. Pt advised to follow up with PCP if symptoms persist. Return precautions discussed at bedside and given in discharge paperwork. Pt is stable for discharge.  I personally performed the services described in this documentation, which was scribed in my presence. The recorded information has been reviewed and is accurate.   Lynn Heimlich, PA-C 01/20/16 1929  Eber Hong, MD 01/21/16 [phone]

## 2016-01-20 NOTE — ED Notes (Signed)
Declined W/C at D/C and was escorted to lobby by RN. 

## 2016-01-20 NOTE — Discharge Instructions (Signed)
Schedule an appointment with a PCP from the resource guide or [facility] if your pain does not improve.   Dolor msculoesqueltico (Musculoskeletal Pain) El dolor musculoesqueltico se siente en huesos y msculos. El dolor puede ocurrir en cualquier parte del cuerpo. El profesional que lo asiste podr tratarlo sin Geologist, engineering causa del dolor. Lo tratar [employer] de laboratorio (sangre y [place]), las radiografas y otros estudios sean normales. La causa de estos dolores puede ser un virus.  CAUSAS Generalmente no existe una causa definida para este trastorno. Tambin el [employer] puede deberse a la [place]. En la actividad excesiva se incluye el hacer ejercicios fsicos muy intensos cuando no se est en buena forma. El dolor de huesos tambin puede deberse a cambios climticos. Los huesos son sensibles a los cambios en la presin atmosfrica. INSTRUCCIONES PARA EL CUIDADO DOMICILIARIO  Para proteger su privacidad, no se entregarn los [employer] pruebas por telfono. Asegrese de conseguirlos. Consulte el modo en que podr obtenerlos si no se lo han informado. Es su responsabilidad contar con los [employer].  Utilice los medicamentos de venta libre o de prescripcin para Chief Technology Officer, Environmental health practitioner o la [place], segn se lo indique el profesional que lo asiste. Si le han administrado medicamentos, no conduzca, no opere maquinarias ni Diplomatic Services operational officer, y tampoco firme documentos legales durante 24 horas. No beba alcohol. No tome pldoras para dormir ni otros medicamentos que Museum/gallery curator.  Podr seguir con todas las actividades a menos que stas le ocasionen ms [employer]. Cuando el dolor disminuya, es importante que gradualmente reanude toda la rutina habitual. Retome las actividades comenzando lentamente. Aumente gradualmente la intensidad y la duracin de sus actividades o del ejercicio.  Durante los perodos de dolor  intenso, el reposo en cama puede ser beneficioso. Recustese o sintese en la posicin que le sea ms cmoda.  Coloque hielo sobre la zona afectada.  Ponga hielo en Lucile Shutters.  Colquese una toalla entre la piel y la bolsa de hielo.  Aplique el hielo durante 10 a 20 minutos 3  4 veces por da.  Si el dolor empeora, o no desaparece puede ser [employer] repetir las pruebas o Education officer, environmental nuevos exmenes. El profesional que lo asiste podr requerir investigar ms profundamente para Veterinary surgeon causa posible. SOLICITE ATENCIN MDICA DE INMEDIATO SI:  Siente que el dolor empeora y no se alivia con los medicamentos.  Siente dolor en el pecho asociado a falta de aire, sudoracin, nuseas o vmitos.  El dolor se localiza en el abdomen.  Comienza a sentir nuevos sntomas que parecen ser diferentes o que lo preocupan. ASEGRESE DE QUE:   Comprende las instrucciones para el alta mdica.  Controlar su enfermedad.  Solicitar atencin mdica de inmediato segn las indicaciones.   Esta informacin no tiene Theme park manager el consejo del mdico. Asegrese de hacerle al mdico cualquier pregunta que tenga.   Document Released: 09/16/2005 Document Revised: 02/29/2012 Elsevier Interactive Patient Education [employer]

## 2016-01-20 NOTE — ED Notes (Signed)
Pt with R lat thigh pain since Sat.  Denies injury.  Some relief with icy hot.

## 2016-08-31 ENCOUNTER — Telehealth (HOSPITAL_COMMUNITY): Payer: Self-pay | Admitting: *Deleted

## 2016-08-31 NOTE — Telephone Encounter (Signed)
Telephoned patient at home number and left message to return call to [facility] 

## 2016-09-08 ENCOUNTER — Other Ambulatory Visit (HOSPITAL_COMMUNITY): Payer: Self-pay | Admitting: *Deleted

## 2016-09-08 DIAGNOSIS — N644 Mastodynia: Secondary | ICD-10-CM

## 2016-09-09 ENCOUNTER — Other Ambulatory Visit: Payer: Self-pay

## 2016-09-17 ENCOUNTER — Ambulatory Visit
Admission: RE | Admit: 2016-09-17 | Discharge: 2016-09-17 | Disposition: A | Discharge status: Home / Self Care | Payer: No Typology Code available for payment source | Source: Ambulatory Visit | Attending: Obstetrics and Gynecology | Admitting: Obstetrics and Gynecology

## 2016-09-17 ENCOUNTER — Ambulatory Visit (HOSPITAL_COMMUNITY)
Admission: RE | Admit: 2016-09-17 | Discharge: 2016-09-17 | Disposition: A | Discharge status: Home / Self Care | Payer: Self-pay | Source: Ambulatory Visit | Attending: Obstetrics and Gynecology | Admitting: Obstetrics and Gynecology

## 2016-09-17 ENCOUNTER — Encounter (HOSPITAL_COMMUNITY): Payer: Self-pay | Admitting: *Deleted

## 2016-09-17 VITALS — BP 120/72 | Temp 98.0°F | Ht 59.0 in | Wt 128.8 lb

## 2016-09-17 DIAGNOSIS — N644 Mastodynia: Secondary | ICD-10-CM

## 2016-09-17 DIAGNOSIS — Z1239 Encounter for other screening for malignant neoplasm of breast: Secondary | ICD-10-CM

## 2016-09-17 NOTE — Progress Notes (Signed)
Complaints of bilateral breast pain that started with the left breast 5-6 months ago and then her right breast 2 weeks ago. Patient states the pain comes and goes. Patient rates the pain at a 4 out of 10.  Pap Smear:  Pap smear not completed today. Last Pap smear was in April or May 2015 at [facility] and normal per patient. Per patient has no history of an abnormal Pap smear. No Pap smear results are in EPIC.  Physical exam: Breasts Breasts symmetrical. No skin abnormalities bilateral breasts. No nipple retraction bilateral breasts. No nipple discharge bilateral breasts. No lymphadenopathy. No lumps palpated bilateral breasts. Complaints of bilateral outer breast tenderness on exam that is greater within there left breast. [facility]. Appointment scheduled for Thursday, September 17, 2016 at 1500.        Pelvic/Bimanual No Pap smear completed today since last Pap smear was in April or May 2015. Pap smear not indicated per [facility]CP guidelines.   Smoking History: Patient has never smoked.  Patient Navigation: Patient education provided. [facility]. Spanish interpreter provided.  Colorectal Cancer Screening: Per patient has never had a colonoscopy. No complaints today.  Used Spanish interpreter [employer] Mora from [facility].

## 2016-09-17 NOTE — Patient Instructions (Signed)
Explained breast self awareness to [name].Patient did not need a Pap smear today due to last Pap smear was in April or May 2015 per patient. Let her know [facility] will cover Pap smears every 3 years unless has a history of abnormal Pap smears. Reminded patient that her next Pap smear is due next April or May 2018 and to call Sabrina to schedule with [facility]. [facility]. Appointment scheduled for Thursday, September 17, 2016 at 1500. Leretha DykesGloria P [name] verbalized understanding.  Jereme Loren, Kathaleen Maserhristine Poll, RN 4:40 PM

## 2016-09-18 ENCOUNTER — Inpatient Hospital Stay: Admission: RE | Admit: 2016-09-18 | Payer: Self-pay | Source: Ambulatory Visit

## 2016-09-18 ENCOUNTER — Other Ambulatory Visit: Payer: Self-pay

## 2016-09-22 ENCOUNTER — Encounter (HOSPITAL_COMMUNITY): Payer: Self-pay | Admitting: *Deleted

## 2019-08-08 ENCOUNTER — Other Ambulatory Visit: Payer: Self-pay

## 2019-08-08 DIAGNOSIS — Z20822 Contact with and (suspected) exposure to covid-19: Secondary | ICD-10-CM

## 2019-08-09 LAB — NOVEL CORONAVIRUS, NAA: SARS-CoV-2, NAA: NOT DETECTED

## 2020-08-10 ENCOUNTER — Other Ambulatory Visit: Payer: Self-pay

## 2020-08-10 ENCOUNTER — Ambulatory Visit: Payer: Self-pay | Admitting: Internal Medicine

## 2020-08-10 ENCOUNTER — Encounter: Payer: Self-pay | Admitting: Internal Medicine

## 2020-08-10 VITALS — BP 121/83 | HR 66 | Temp 98.5°F | Ht 59.0 in | Wt 124.6 lb

## 2020-08-10 DIAGNOSIS — Z0000 Encounter for general adult medical examination without abnormal findings: Secondary | ICD-10-CM

## 2020-08-10 NOTE — Progress Notes (Signed)
Lynn [name] is an 54 y.o. female.   Chief Complaint: No complaints HPI: Patient states she is here for the yellow card. Has no active medical issues and is currently not on any medications. Sh has not seen a physician for several several years and has a family h/o DM. Doen c/o dental issues with sensitivity to hot and cold.  Past Medical History:  Diagnosis Date  . Hypertension     No past surgical history on file.  Family History  Problem Relation Age of Onset  . Diabetes Mother   . Diabetes Father   . Diabetes Sister   . Cancer Paternal Grandmother    Social History:  reports that she has never smoked. She has never used smokeless tobacco. She reports that she does not drink alcohol and does not use drugs.  Allergies: No Known Allergies  (Not in a hospital admission)   No results found for this or any previous visit (from the past 48 hour(s)). No results found.  Review of Systems As per HPI all others negative   Last menstrual period 07/13/2014. Physical Exam   Assessment/Plan  CBC, CMP, FLP, TSH  Need to see a dentist.  F/U in 2 weeks  Kirt Boys, MD 08/10/2020, 8:59 AM

## 2020-08-15 ENCOUNTER — Other Ambulatory Visit: Payer: Self-pay | Admitting: Internal Medicine

## 2020-08-16 LAB — CBC WITH DIFFERENTIAL/PLATELET
Basophils Absolute: 0 10*3/uL (ref 0.0–0.2)
Basos: 1 %
EOS (ABSOLUTE): 0.2 10*3/uL (ref 0.0–0.4)
Eos: 3 %
Hematocrit: 40.2 % (ref 34.0–46.6)
Hemoglobin: 13.6 g/dL (ref 11.1–15.9)
Immature Grans (Abs): 0 10*3/uL (ref 0.0–0.1)
Immature Granulocytes: 0 %
Lymphocytes Absolute: 2.1 10*3/uL (ref 0.7–3.1)
Lymphs: 39 %
MCH: 30.3 pg (ref 26.6–33.0)
MCHC: 33.8 g/dL (ref 31.5–35.7)
MCV: 90 fL (ref 79–97)
Monocytes Absolute: 0.4 10*3/uL (ref 0.1–0.9)
Monocytes: 7 %
Neutrophils Absolute: 2.8 10*3/uL (ref 1.4–7.0)
Neutrophils: 50 %
Platelets: 267 10*3/uL (ref 150–450)
RBC: 4.49 x10E6/uL (ref 3.77–5.28)
RDW: 11.9 % (ref 11.7–15.4)
WBC: 5.4 10*3/uL (ref 3.4–10.8)

## 2020-08-16 LAB — COMPREHENSIVE METABOLIC PANEL
ALT: 31 IU/L (ref 0–32)
AST: 33 IU/L (ref 0–40)
Albumin/Globulin Ratio: 1.3 (ref 1.2–2.2)
Albumin: 4.4 g/dL (ref 3.8–4.9)
Alkaline Phosphatase: 107 IU/L (ref 48–121)
BUN/Creatinine Ratio: 15 (ref 9–23)
BUN: 10 mg/dL (ref 6–24)
Bilirubin Total: 0.4 mg/dL (ref 0.0–1.2)
CO2: 23 mmol/L (ref 20–29)
Calcium: 9.3 mg/dL (ref 8.7–10.2)
Chloride: 105 mmol/L (ref 96–106)
Creatinine, Ser: 0.66 mg/dL (ref 0.57–1.00)
GFR calc Af Amer: 116 mL/min/{1.73_m2} (ref >59)
GFR calc non Af Amer: 100 mL/min/{1.73_m2} (ref >59)
Globulin, Total: 3.5 g/dL (ref 1.5–4.5)
Glucose: 107 mg/dL — ABNORMAL HIGH (ref 65–99)
Potassium: 4.3 mmol/L (ref 3.5–5.2)
Sodium: 140 mmol/L (ref 134–144)
Total Protein: 7.9 g/dL (ref 6.0–8.5)

## 2020-08-16 LAB — LIPID PANEL W/O CHOL/HDL RATIO
Cholesterol, Total: 188 mg/dL (ref 100–199)
HDL: 48 mg/dL (ref >39)
LDL Chol Calc (NIH): 118 mg/dL — ABNORMAL HIGH (ref 0–99)
Triglycerides: 124 mg/dL (ref 0–149)
VLDL Cholesterol Cal: 22 mg/dL (ref 5–40)

## 2020-08-16 LAB — TSH: TSH: 1.64 u[IU]/mL (ref 0.450–4.500)

## 2020-08-16 LAB — HGB A1C W/O EAG: Hgb A1c MFr Bld: 6 % — ABNORMAL HIGH (ref 4.8–5.6)

## 2020-09-06 DIAGNOSIS — Q159 Congenital malformation of eye, unspecified: Secondary | ICD-10-CM | POA: Insufficient documentation

## 2020-09-07 ENCOUNTER — Other Ambulatory Visit: Payer: Self-pay

## 2020-09-07 ENCOUNTER — Ambulatory Visit: Payer: Self-pay | Admitting: Internal Medicine

## 2020-09-07 ENCOUNTER — Encounter: Payer: Self-pay | Admitting: Internal Medicine

## 2020-09-07 DIAGNOSIS — H547 Unspecified visual loss: Secondary | ICD-10-CM

## 2020-09-07 NOTE — Progress Notes (Signed)
Internal MEDICINE  Office Visit Note  Patient Name: Lynn [name]  [id number]  [id number]  Date of Service: 09/07/2020  No chief complaint on file. want to know labs results;  C/o of vision issue- need to see eye doctor- last seen ? 4 years ago  HPI  Review all labs-from- 08/15/20 No medical issue-  C/o Eye issue- problem seeing  Pt is here for routine follow up.    Current Medication: Not taking any Outpatient Encounter Medications as of 09/07/2020  Medication Sig  . HYDROcodone-acetaminophen (NORCO/VICODIN) 5-325 MG per tablet Take 1 tablet by mouth every 4 (four) hours as needed for moderate pain or severe pain. (Patient not taking: Reported on 09/17/2016)  . ibuprofen (ADVIL,MOTRIN) 800 MG tablet Take 1 tablet (800 mg total) by mouth every 8 (eight) hours as needed for mild pain or moderate pain. (Patient not taking: Reported on 09/17/2016)  . ibuprofen (ADVIL,MOTRIN) 800 MG tablet Take 1 tablet (800 mg total) by mouth 3 (three) times daily. (Patient not taking: Reported on 09/17/2016)  . methocarbamol (ROBAXIN) 500 MG tablet Take 1 tablet (500 mg total) by mouth 2 (two) times daily. (Patient not taking: Reported on 09/17/2016)   No facility-administered encounter medications on file as of 09/07/2020.    Surgical History: No past surgical history on file.  Medical History: Past Medical History:  Diagnosis Date  . Hypertension     Family History: Family History  Problem Relation Age of Onset  . Diabetes Mother   . Diabetes Father   . Diabetes Sister   . Cancer Paternal Grandmother     Social History   Socioeconomic History  . Marital status: Married    Spouse name: Not on file  . Number of children: Not on file  . Years of education: Not on file  . Highest education level: Not on file  Occupational History  . Not on file  Tobacco Use  . Smoking status: Never Smoker  . Smokeless tobacco: Never Used  Substance and Sexual Activity  . Alcohol use: No   . Drug use: No  . Sexual activity: Yes    Birth control/protection: Condom  Other Topics Concern  . Not on file  Social History Narrative  . Not on file   Social Determinants of Health   Financial Resource Strain:   . Difficulty of Paying Living Expenses: Not on file  Food Insecurity:   . Worried About Programme researcher, broadcasting/film/video in the Last Year: Not on file  . Ran Out of Food in the Last Year: Not on file  Transportation Needs:   . Lack of Transportation (Medical): Not on file  . Lack of Transportation (Non-Medical): Not on file  Physical Activity:   . Days of Exercise per Week: Not on file  . Minutes of Exercise per Session: Not on file  Stress:   . Feeling of Stress : Not on file  Social Connections:   . Frequency of Communication with Friends and Family: Not on file  . Frequency of Social Gatherings with Friends and Family: Not on file  . Attends Religious Services: Not on file  . Active Member of Clubs or Organizations: Not on file  . Attends Banker Meetings: Not on file  . Marital Status: Not on file  Intimate Partner Violence:   . Fear of Current or Ex-Partner: Not on file  . Emotionally Abused: Not on file  . Physically Abused: Not on file  . Sexually Abused: Not on file  Review of Systems  Vital Signs: LMP 07/13/2014    Physical Exam No done    Assessment/Plan: All  Labs reviewed- all ok, discuss with patient Need vision screening- - will see if we can get optho cliinc referral for eye check.   General Counseling: romy ipock understanding of the findings of todays visit and agrees with plan of treatment. I have discussed any further diagnostic evaluation that may be needed or ordered today. We also reviewed her medications today. she has been encouraged to call the office with any questions or concerns that should arise related to todays visit.    No orders of the defined types were placed in this encounter.   No orders of the  defined types were placed in this encounter.       Georgann Housekeeper

## 2020-09-21 ENCOUNTER — Ambulatory Visit: Payer: Self-pay

## 2020-09-21 ENCOUNTER — Other Ambulatory Visit: Payer: Self-pay

## 2020-09-21 NOTE — Progress Notes (Signed)
Patient has experienced a lot of stress with leaving her church in the last 7-8 months. During this process she lost her appetite. Reviewed labs with patient and added in a snack and dinner to daily intake. Patient does not eat vegetables and has high carbohydrate intake. Encouraged to eat vegetables. Provided education on carbohydrates. Plan to eat small yogurt between breakfast and lunch. Adjust dinner to add protein or have soup and salad or meat and salad.  Current diet:  7:30 am Breakfast: 1 egg + 2 small corn tortillas, water 3:00 pm Lunch: chicken/meat + beans+ 2-3 small corn tortillas, water 7:00 pm Dinner/snack: banana+ orange  F/u: 3 months, check HbA1C prior to next appt

## 2021-01-04 ENCOUNTER — Ambulatory Visit: Payer: Self-pay

## 2021-01-11 ENCOUNTER — Ambulatory Visit: Payer: Self-pay

## 2021-01-25 ENCOUNTER — Other Ambulatory Visit: Payer: Self-pay

## 2021-01-25 ENCOUNTER — Ambulatory Visit: Payer: Self-pay

## 2021-01-25 NOTE — Progress Notes (Signed)
Wake: 6-30-7 am  Breakfast: same time as wake, 1 egg, cinamon toast crunches with  2% milk, and 1 banana  Lunch: 11am burrito with cheese, beans, chicken, cactus with water  Dinner: 6-7 pm tea pineapple, peaches  Snack: strawberry yogurt  Drink: water, sometimes lemonade (2 times a week), 2 times a week drinks sprite, coffee 2 times a week  Physical Activity: 10 minutes of walking per day  Medications: no medications  Goals:  1) change the fruits to berries and melons 2) 30 minutes of exercise 3-5 times per week 3) change dinner to fruits and nuts to increase protein intake and help better manage blood sugar levels  Follow up: 3 months and check HgA1C before next appt

## 2021-04-26 ENCOUNTER — Ambulatory Visit: Payer: Self-pay

## 2021-05-10 ENCOUNTER — Ambulatory Visit: Payer: Self-pay

## 2021-06-07 ENCOUNTER — Telehealth: Payer: Self-pay

## 2021-06-07 ENCOUNTER — Other Ambulatory Visit: Payer: Self-pay

## 2021-06-07 ENCOUNTER — Ambulatory Visit: Payer: Self-pay

## 2021-06-07 NOTE — Progress Notes (Unsigned)
Breakfast: Wake up at 7:30-8:00 am. 1 egg fried and tea.  Lunch: Eats a burrito (bean, lettuce, onions, pollo, cheese, avocado) for lunch at 11:30 am with a cup of water Snacks: no Dinner: Eat dinner at 6:30 pm. Fruits (oranges, bananas, or strawberries) and yogurt (yoplait) PA: walk daily with dog for 50 minutes Fluid Intake: tea (1 cup 20 oz) and water (5-6 bottles) Dental issues: sensitive to cold and hot temperature for beverage Medications: none  Goals: Encouraged patient to measure portions of intake a few times to get a good sense of how much she is consuming. Will follow up with in 3 months. Recommend HgA1C check prior to next appt.

## 2021-09-06 ENCOUNTER — Ambulatory Visit: Payer: Self-pay

## 2022-09-14 ENCOUNTER — Encounter (HOSPITAL_COMMUNITY): Payer: Self-pay | Admitting: *Deleted

## 2022-09-14 ENCOUNTER — Ambulatory Visit (HOSPITAL_COMMUNITY)
Admission: EM | Admit: 2022-09-14 | Discharge: 2022-09-14 | Disposition: A | Discharge status: Home / Self Care | Payer: No Typology Code available for payment source | Attending: Emergency Medicine | Admitting: Emergency Medicine

## 2022-09-14 ENCOUNTER — Other Ambulatory Visit: Payer: Self-pay

## 2022-09-14 DIAGNOSIS — K0889 Other specified disorders of teeth and supporting structures: Secondary | ICD-10-CM

## 2022-09-14 MED ORDER — AMOXICILLIN-POT CLAVULANATE 875-125 MG PO TABS: 1.0000 | ORAL_TABLET | Freq: Two times a day (BID) | ORAL | 0 refills | Status: DC

## 2022-09-14 MED ORDER — NYSTATIN 100000 UNIT/ML MT SUSP: 5.0000 mL | Freq: Three times a day (TID) | OROMUCOSAL | 0 refills | Status: DC | PRN

## 2022-09-14 MED ORDER — IBUPROFEN 800 MG PO TABS: 800.0000 mg | ORAL_TABLET | Freq: Three times a day (TID) | ORAL | 0 refills | Status: AC

## 2022-09-14 NOTE — Discharge Instructions (Addendum)
Hoy ests siendo tratado por dolor dental y brindando cobertura por infeccin.  Tome Augmentin todas las maanas y todas las noches durante los prximos 7 das.  Puede hacer grgaras y escupir una solucin de enjuague bucal mgico cada 8 horas segn sea necesario para ayudar con Health and safety inspector. Tambin puede intentar hacer grgaras con agua salada y con Listerine adems de esto.  Puede usar ibuprofeno cada 8 horas segn sea necesario, puede continuar tomando Tylenol adems de esto.  Programe una cita de seguimiento con su dentista para reevaluar sus sntomas.   Today you are being treated for dental pain and provided coverage for infection  Take Augmentin every morning and every evening for the next 7 days  May gargle and spit Magic mouthwash solution every 8 hours as needed to help with discomfort May also attempt salt water gargles and Listerine gargles in addition to this  May use ibuprofen every 8 hours as needed, may continue to take Tylenol in addition to this  Please schedule a follow-up appointment with your dentist for reevaluation of your symptoms

## 2022-09-14 NOTE — ED Provider Notes (Signed)
[place]    CSN: [id number] Arrival date & time: 09/14/22  1259      History   Chief Complaint Chief Complaint  Patient presents with   Oral Pain   Headache    HPI Lynn [name] is a 56 y.o. female.   Patient presents with right-sided lower gum and dental pain and swelling since July 2023 after having tooth removed by dentist.  Pain has become generalized to the entirety of the mouth worsened by chewing  and movement.  Has become difficulty to tolerate foods and symptoms are exacerbated with exposure to hot and cold.  Pain has begun to radiate to the ear and throat and she has begun to experience fevers intermittently at nighttime.  Has attempted use of Tylenol which has been ineffective.  Has not notified dentist.    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 No pertinent surgical history.  OB History     Gravida  4   Para      Term      Preterm      AB      Living  4      SAB      IAB      Ectopic      Multiple      Live Births  4            Home Medications    Prior to Admission medications   Not on File    Family History Family History  Problem Relation Age of Onset   Diabetes Mother    Diabetes Father    Diabetes Sister    Cancer Paternal Grandmother     Social History Social History   Tobacco Use   Smoking status: Never   Smokeless tobacco: Never  Vaping Use   Vaping Use: Never used  Substance Use Topics   Alcohol use: No   Drug use: No     Allergies   Patient has no known allergies.   Review of Systems Review of Systems  Constitutional: Negative.   HENT:  Positive for dental problem. Negative for congestion, drooling, ear discharge, ear pain, facial swelling, hearing loss, mouth sores, nosebleeds, postnasal drip, rhinorrhea,  sinus pressure, sinus pain, sneezing, sore throat, tinnitus, trouble swallowing and voice change.   Respiratory: Negative.    Cardiovascular: Negative.   Skin: Negative.   Neurological: Negative.      Physical Exam Triage Vital Signs ED Triage Vitals  Enc Vitals Group     BP 09/14/22 1405 126/76     Pulse Rate 09/14/22 1405 76     Resp 09/14/22 1405 18     Temp 09/14/22 1405 98.4 F (36.9 C)     Temp src --      SpO2 09/14/22 1405 100 %     Weight --      Height --      Head Circumference --      Peak Flow --      Pain Score 09/14/22 1403 8     Pain Loc --      Pain Edu? --      Excl. in [place]? --    No data found.  Updated Vital Signs BP 126/76   Pulse 76   Temp 98.4 F (36.9 C)  Resp 18   LMP 07/13/2014   SpO2 100%   Visual Acuity Right Eye Distance:   Left Eye Distance:   Bilateral Distance:    Right Eye Near:   Left Eye Near:    Bilateral Near:     Physical Exam Constitutional:      Appearance: Normal appearance.  HENT:     Head: Normocephalic.     Mouth/Throat:     Comments: Mild right cheek swelling present, no abnormalities noted to the right upper or lower gumline, pharynx is clear without obstruction, no abscess noted Eyes:     Extraocular Movements: Extraocular movements intact.  Skin:    General: Skin is warm and dry.  Neurological:     Mental Status: She is alert and oriented to person, place, and time. Mental status is at baseline.  Psychiatric:        Mood and Affect: Mood normal.        Behavior: Behavior normal.      UC Treatments / Results  Labs (all labs ordered are listed, but only abnormal results are displayed) Labs Reviewed - No data to display  EKG   Radiology No results found.  Procedures Procedures (including critical care time)  Medications Ordered in UC Medications - No data to display  Initial Impression / Assessment and Plan / UC Course  I have reviewed the triage vital signs and the nursing  notes.  Pertinent labs & imaging results that were available during my care of the patient were reviewed by me and considered in my medical decision making (see chart for details).  Dental pain  As symptoms have been present for least a month we will provide bacterial coverage although no abscess or further signs of infection are noted within the oropharynx, discussed with patient, prescribed Augmentin as well as Magic mouthwash and ibuprofen 800 mg for additional supportive measures, may attempt soft foods, increase fluid intake salt water gargles and throat lozenges for additional support, strongly advised patient to follow-up with dentist for reevaluation Final Clinical Impressions(s) / UC Diagnoses   Final diagnoses:  None   Discharge Instructions   None    ED Prescriptions   None    PDMP not reviewed this encounter.   Hans Eden, NP 09/14/22 1623

## 2022-09-14 NOTE — ED Triage Notes (Signed)
PT reports she had a tooth pulled on The rt side of her mouth in July . Pt has had pain,swelling to mouth since July. Pt reports now her whole head hurts .

## 2023-07-27 ENCOUNTER — Other Ambulatory Visit: Payer: Self-pay | Admitting: Obstetrics and Gynecology

## 2023-07-27 DIAGNOSIS — Z1231 Encounter for screening mammogram for malignant neoplasm of breast: Secondary | ICD-10-CM

## 2023-09-09 ENCOUNTER — Ambulatory Visit
Admission: RE | Admit: 2023-09-09 | Discharge: 2023-09-09 | Disposition: A | Discharge status: Home / Self Care | Payer: No Typology Code available for payment source | Source: Ambulatory Visit | Attending: Obstetrics and Gynecology | Admitting: Obstetrics and Gynecology

## 2023-09-09 ENCOUNTER — Ambulatory Visit: Payer: Self-pay | Admitting: Hematology and Oncology

## 2023-09-09 VITALS — BP 141/87 | Wt 126.0 lb

## 2023-09-09 DIAGNOSIS — Z1231 Encounter for screening mammogram for malignant neoplasm of breast: Secondary | ICD-10-CM

## 2023-09-09 DIAGNOSIS — Z1211 Encounter for screening for malignant neoplasm of colon: Secondary | ICD-10-CM

## 2023-09-09 DIAGNOSIS — Z124 Encounter for screening for malignant neoplasm of cervix: Secondary | ICD-10-CM

## 2023-09-09 DIAGNOSIS — Z01419 Encounter for gynecological examination (general) (routine) without abnormal findings: Secondary | ICD-10-CM

## 2023-09-09 NOTE — Patient Instructions (Signed)
Taught [name] Plata-Patricio about self breast awareness and gave educational materials to take home. Patient did need a Pap smear today due to last Pap smear was in 2019 per patient. Let her know [facility] will cover Pap smears every 5 years unless has a history of abnormal Pap smears. Referred patient to the [facility] for screening mammogram. Appointment scheduled for 09/09/23. Patient aware of appointment and will be there. Let patient know will follow up with her within the next couple weeks with results. [name] Plata-Patricio verbalized understanding.  Pascal Lux, NP 10:32 AM

## 2023-09-09 NOTE — Progress Notes (Signed)
[name] is a 57 y.o. G4P0 female who presents to [facility] clinic today with no complaints.    Pap Smear: Pap smear completed today. Last Pap smear was unknown and was normal. Per [name] has no history of an abnormal Pap smear. Last Pap smear result is not available in Epic.   Physical exam: Breasts Breasts symmetrical. No skin abnormalities bilateral breasts. No nipple retraction bilateral breasts. No nipple discharge bilateral breasts. No lymphadenopathy. No lumps palpated bilateral breasts.       Pelvic/Bimanual Ext Genitalia No lesions, no swelling and no discharge observed on external genitalia.        Vagina Vagina pink and normal texture. No lesions or discharge observed in vagina.        Cervix Cervix is present. Cervix pink and of normal texture. No discharge observed.    Uterus Uterus is present and palpable. Uterus in normal position and normal size.        Adnexae Bilateral ovaries present and palpable. No tenderness on palpation.         Rectovaginal No rectal exam completed today since [name] had no rectal complaints. No skin abnormalities observed on exam.     Smoking History: [name] has never smoked and was not referred to quit line.    [name] Navigation: [name] education provided. Access to services provided for [name] through [facility] program. Natale Lay interpreter provided. No transportation provided   Colorectal Cancer Screening: Per [name] has never had colonoscopy completed No complaints today. FIT test given.   Breast and Cervical Cancer Risk Assessment: [name] does not have family history of breast cancer, known genetic mutations, or radiation treatment to the chest before age 33. [name] does not have history of cervical dysplasia, immunocompromised, or DES exposure in-utero.  Risk Scores as of Encounter on 09/09/2023     Dondra Spry           5-year 1.3%   Lifetime 7.96%   Lynn [name] is Hisp[place]/Latina but has no documented  birth country, so the [place] model used data from [place] patients to calculate their risk score. Document a birth country in the Demographics activity for a more accurate score.         Last calculated by Caprice Red, CMA on 09/09/2023 at 10:01 AM        A: [facility] exam with pap smear No complaints with benign exam.   P: Referred [name] to the [facility] for a screening mammogram. Appointment scheduled 09/09/23.  Ilda Basset A, NP 09/09/2023 10:30 AM

## 2023-09-14 LAB — FECAL OCCULT BLOOD, IMMUNOCHEMICAL: Fecal Occult Bld: NEGATIVE

## 2023-09-15 ENCOUNTER — Other Ambulatory Visit: Payer: Self-pay

## 2023-09-15 ENCOUNTER — Inpatient Hospital Stay: Payer: No Typology Code available for payment source | Attending: Obstetrics and Gynecology | Admitting: *Deleted

## 2023-09-15 VITALS — BP 124/80 | Ht 59.0 in | Wt 128.0 lb

## 2023-09-15 DIAGNOSIS — Z0000 Encounter for general adult medical examination without abnormal findings: Secondary | ICD-10-CM

## 2023-09-15 LAB — CYTOLOGY - PAP
Comment: NEGATIVE
Comment: NEGATIVE
Comment: NEGATIVE
Diagnosis: UNDETERMINED — AB
HPV 16: NEGATIVE
HPV 18 / 45: NEGATIVE
High risk HPV: POSITIVE — AB

## 2023-09-15 NOTE — Progress Notes (Signed)
[facility] initial screening   Interpreter- Natale Lay, [employer]   Clinical Measurement: There were no vitals filed for this visit. Fasting Labs Drawn Today, will review with patient when they result.   Medical History: Patient states that she does not know if she has high cholesterol, does not have high blood pressure and she does not have diabetes.  Medications: Patient states that she does not take medication to lower cholesterol, blood pressure or blood sugar.  Patient does not take an aspirin a day to help prevent a heart attack or stroke.    Blood pressure, self measurement: Patient states that she does not have the equipment to measure blood pressure from home. She checks her blood pressure N/A. She shares her readings with a health care provider: N/A.   Nutrition: Patient states that on average she eats 1 cups of fruit and 2 cups of vegetables per day. Patient states that she does not eat fish at least 2 times per week. Patient eats less than half servings of whole grains. Patient drinks less than 36 ounces of beverages with added sugar weekly: yes. Patient is currently watching sodium or salt intake: yes. In the past 7 days patient has consumed drinks containing alcohol on 0 days. On a day that patient consumes drinks containing alcohol on average 0 drinks are consumed.      Physical activity: Patient states that she gets 0 minutes of moderate and 0 minutes of vigorous physical activity each week.  Smoking status: Patient states that she has has never smoked .   Quality of life: Over the past 2 weeks patient states that she had little interest or pleasure in doing things: not at all. She has been feeling down, depressed or hopeless:several days.   Social Determinants of Health Assessment:   Computer Use: During the last 12 months patient states that she has used any of the following: desktop/laptop, smart phone or tablet/other portable wireless computer: yes.   Internet Use: During  the last 12 months, did you or any member of your household have access to the internet: Yes, by paying a cell phone company or internet service provider.   Food Insecurities: During the last 12 months, where there any times when you were worried that you would run out of food because of a lack of money or other resources: No.   Transportation Barriers: During the last 12 months, have you missed a doctor's appointment because of transportation problems: No.   Childcare Barriers: If you are currently using childcare services, please identify  the type of services you use. (If not using childcare services, please select "Not applicable"): not applicable. During the last 12 months, have you had any barriers to childcare services such as: not applicable.   Housing: What is your housing situation today: I have housing.   Intimate Partner Violence: During the last 12 months, how often did your partner physically hurt you: never. During the last 12 months, how often did your partner insult you or talk down to you: never.  Medication Adherence: During the last 12 months, did you ever forget to take your medicine: not applicable. During the last 12 months, were you careless ar times about taking your medicine: not applicable. During the last 12 months, when you felt better did you sometimes stop taking your medication: not applicable. During the last 12 months, sometimes if you felt worse when you took your medicine did you stop taking it: not applicable.   Risk reduction and counseling:  Health Coaching: Spoke with patient in detail about practicing a heart healthy diet. Spoke with patient about the daily recommendations for fruits and vegetables. Showed patient what a serving size would look like. Spoke with patient about adding heart healthy fish into regular weekly diet. Gave recommendations for salmon, tuna, mackerel, sardines, sea bass and trout. Patient consumes oatmeal and sometimes whole grain  cereals. Gave suggestions for other whole grains such as whole wheat bread, brown rice and whole wheat pasta. Spoke with patient about exercise and adding regular exercise into her routine. Patient has set an exercise based goal to work on.  Goal: Patient will start exercising (walking) 2-3 days per week for 20 minutes. Patient will work on reaching this goal over the next month.    Navigation:  I will notify patient of lab results. Patient is aware of 2 more health coaching sessions and a follow up.  Time: 25 minutes

## 2023-09-16 LAB — GLUCOSE, RANDOM: Glucose: 112 mg/dL — ABNORMAL HIGH (ref 70–99)

## 2023-09-16 LAB — LIPID PANEL
Chol/HDL Ratio: 4.8 ratio — ABNORMAL HIGH (ref 0.0–4.4)
Cholesterol, Total: 193 mg/dL (ref 100–199)
HDL: 40 mg/dL (ref >39)
LDL Chol Calc (NIH): 127 mg/dL — ABNORMAL HIGH (ref 0–99)
Triglycerides: 142 mg/dL (ref 0–149)
VLDL Cholesterol Cal: 26 mg/dL (ref 5–40)

## 2023-09-16 LAB — HEMOGLOBIN A1C
Est. average glucose Bld gHb Est-mCnc: 134 mg/dL
Hgb A1c MFr Bld: 6.3 % — ABNORMAL HIGH (ref 4.8–5.6)

## 2023-09-20 ENCOUNTER — Telehealth: Payer: Self-pay

## 2023-09-20 NOTE — Telephone Encounter (Signed)
Health coaching 2   interpreter- Natale Lay, [employer]   Labs- 193 cholesterol, 127 LDL cholesterol, 142 triglycerides, 40 HDL cholesterol, hemoglobin A1C , mean plasma glucose. Patient understands and is aware of her lab results.   Goals- 1. Watch the amount of fried and fatty foods consumed. Try to grill, bake, broil or sautee foods instead. 2. Look for low-fat or reduced fat options. 3. Daily exercise for 20-30 minutes. 4. Watch the amount of sweet and sugary food and drinks consumed.  5. Watch the amount of carb rich foods consumed. Specifically reducing the amount of tortillas consumed daily. Try to decrease by half (from 6-8 to 3-4 daily).    Navigation:  Patient is aware of 1 more health coaching sessions and a follow up. Will refer patient to Internal Medicine for FU for elevated labs. Will call patient with FU appointment information once scheduled. Will refer patient to DPP program through the [facility].  Time- 18 minutes

## 2023-09-28 ENCOUNTER — Telehealth: Payer: Self-pay

## 2023-09-28 NOTE — Telephone Encounter (Signed)
Using [facility] Interpreter, Joslyn Hy, we have attempted to contact the pt regarding her PAP result. A voice message has been left requesting a return call.

## 2023-10-13 ENCOUNTER — Encounter: Payer: Self-pay | Admitting: Student

## 2023-10-13 ENCOUNTER — Ambulatory Visit (INDEPENDENT_AMBULATORY_CARE_PROVIDER_SITE_OTHER): Payer: Self-pay | Admitting: Student

## 2023-10-13 VITALS — BP 115/72 | HR 68 | Temp 98.2°F | Wt 128.1 lb

## 2023-10-13 DIAGNOSIS — Z0000 Encounter for general adult medical examination without abnormal findings: Secondary | ICD-10-CM

## 2023-10-13 DIAGNOSIS — R7303 Prediabetes: Secondary | ICD-10-CM

## 2023-10-13 DIAGNOSIS — Z139 Encounter for screening, unspecified: Secondary | ICD-10-CM

## 2023-10-13 NOTE — Patient Instructions (Signed)
Lynn [name], Lynn [name] por permitirnos brindarle atencin hoy. Hoy discutimos sus examenes de sangre recientes y su salud en general.   He ordenado los siguientes laboratorios para usted:  Lab Orders  No laboratory test(s) ordered today     Pruebas ordenadas hoy:  Referencias ordenadas hoy:  Referral Orders  No referral(s) requested today     He ordenado el siguiente medicamento/cambiado los siguientes medicamentos:  Suspender los siguientes medicamentos: There are no discontinued medications.   Iniciar los siguientes medicamentos: No orders of the defined types were placed in this encounter.    Seguimiento 6 meses   Recordar:   - Favor de seguir caminando 30 minutos al dia y comer saludable. Sigua tomando agua y [employer] en la tarde para ayudar con el hinchazon de los pies. Minimize su uso de sal en la comida.   - Por favor llene el formulario y llame a [place] con cualquier pregunta para que le den un seguro medico "[place]"   - A veces tienen ferias para poner vacunas gratis con la ciudad. Cuando tenga su seguro/tarjeta puede regresar para ponerse la vacuna de la gripe sin costo.    Si tiene alguna pregunta o inquietud, llame a la clnica de medicina interna al [phone].     Krysta Bloomfield Colbert Coyer, MD  [facility]

## 2023-10-14 DIAGNOSIS — Z0000 Encounter for general adult medical examination without abnormal findings: Secondary | ICD-10-CM | POA: Insufficient documentation

## 2023-10-14 DIAGNOSIS — R7303 Prediabetes: Secondary | ICD-10-CM | POA: Insufficient documentation

## 2023-10-14 DIAGNOSIS — Z139 Encounter for screening, unspecified: Secondary | ICD-10-CM | POA: Insufficient documentation

## 2023-10-14 NOTE — Progress Notes (Signed)
New Patient Office Visit  Subjective    Patient ID: Lynn [name], female    DOB: 1966-10-17  Age: 57 y.o. MRN: [id number]  CC:  Chief Complaint  Patient presents with   Establish Care    New wise woman referral follow up recent labs and BP     HPI Lynn [name] presents to establish care following abnormal labs (Hgb A1C 6.3, lipid panel LDL 127) at a [facility] visit on 09/15/23. Patient states she followed recommendations to increase her daily activity level, now walks about 30 minutes every day, and she has made a lot of changes to her daily diet (making an effort to eat more fruits, vegetables, chicken, less bread, less caffeine, still eating tortillas). Endorses good water intake.   She does not have any complaints today. Denied CP, SOB, dysuria, diarrhea/constipation, or N/V. Did indicate on PHQ-9 concerns for depression but when asked to expand further patient talked about these feelings in past tense, did not seem to have current concerns.   Has not had a PCP for some time. Last general health visit prior to [facility] was 3 years ago in [place]. Recent age appropriate health screenings were performed. Pap smear from 09/09/23 with ASCUS, told to repeat in 1 year. Mammogram from 09/09/23 was normal, will need a repeat mammogram in 1 year. Per patient and chart review she was given FIT 09/09/23 that was negative, told to repeat in 1 year, has never had a colonoscopy. Patient was given an [employer] today as patient is currently without insurance. Would like flu shot today but will defer until she has coverage.   PMH:  - Has had some elevated blood pressure readings in the past sometimes in the setting of acute pain. Feels like that has improved since making positive changes in her lifestyle. Does not weigh self at home but thinks she has lost some weight through these changes. Denies history of diabetes or heart problems. No major surgeries. Gave birth to  4 children, all vaginal births with no complications. Has had Hgb on the lower side since, denies any current lightheadedness or weakness. Last CBC 08/15/20 Hgb 13.6.   Family history:  - Parents with diabetes, hypertension.   Medications:  - None  Social:  - Lives with husband and son  - Does not work outside of the home [employer] and family provide support - Performs ADLs and IADLs independently - Denies food insecurity  - Denies alcohol use - Denies tobacco use (former or current) - Denies illicit drug use   Outpatient Encounter Medications as of 10/13/2023  Medication Sig   ibuprofen (ADVIL) 800 MG tablet Take 1 tablet (800 mg total) by mouth 3 (three) times daily. (Patient not taking: Reported on 09/09/2023)   No facility-administered encounter medications on file as of 10/13/2023.    Past Medical History:  Diagnosis Date   Hypertension     No past surgical history on file.  Family History  Problem Relation Age of Onset   Diabetes Mother    Diabetes Father    Diabetes Sister    Cancer Paternal Grandmother     Social History   Socioeconomic History   Marital status: Married    Spouse name: Not on file   Number of children: 4   Years of education: Not on file   Highest education level: 9th grade  Occupational History   Occupation: housewife  Tobacco Use   Smoking status: Never   Smokeless  tobacco: Never  Vaping Use   Vaping status: Never Used  Substance and Sexual Activity   Alcohol use: No   Drug use: No   Sexual activity: Not Currently    Birth control/protection: Post-menopausal  Other Topics Concern   Not on file  Social History Narrative   Not on file   Social Determinants of Health   Financial Resource Strain: Not on file  Food Insecurity: No Food Insecurity (09/09/2023)   Hunger Vital Sign    Worried About Running Out of Food in the Last Year: Never true    Ran Out of Food in the Last Year: Never true  Transportation Needs: No  Transportation Needs (09/09/2023)   PRAPARE - Administrator, Civil Service (Medical): No    Lack of Transportation (Non-Medical): No  Physical Activity: Not on file  Stress: Not on file  Social Connections: Not on file  Intimate Partner Violence: Not on file    Review of Systems  Constitutional:  Positive for weight loss.  Eyes:  Negative for blurred vision and double vision.  Cardiovascular:  Negative for chest pain.  Genitourinary:  Negative for dysuria.      Objective    BP 115/72 (BP Location: Right Arm, Patient Position: Sitting, Cuff Size: Normal)   Pulse 68   Temp 98.2 F (36.8 C) (Oral)   Wt 128 lb 1.6 oz (58.1 kg)   LMP 07/13/2014   SpO2 99%   BMI 25.87 kg/m   Physical Exam HENT:     Head: Normocephalic and atraumatic.     Nose: Nose normal.     Mouth/Throat:     Mouth: Mucous membranes are moist.  Cardiovascular:     Rate and Rhythm: Normal rate and regular rhythm.     Pulses: Normal pulses.     Heart sounds: Normal heart sounds.  Pulmonary:     Effort: Pulmonary effort is normal.     Breath sounds: Normal breath sounds.  Abdominal:     General: Abdomen is flat. Bowel sounds are normal.     Palpations: Abdomen is soft.  Musculoskeletal:        General: Normal range of motion.  Skin:    General: Skin is warm and dry.  Neurological:     General: No focal deficit present.     Mental Status: She is alert.  Psychiatric:        Mood and Affect: Mood normal.        Behavior: Behavior normal.     Last metabolic panel Lab Results  Component Value Date   GLUCOSE 112 (H) 09/15/2023   NA 140 08/15/2020   K 4.3 08/15/2020   CL 105 08/15/2020   CO2 23 08/15/2020   BUN 10 08/15/2020   CREATININE 0.66 08/15/2020   GFRNONAA 100 08/15/2020   CALCIUM 9.3 08/15/2020   PROT 7.9 08/15/2020   ALBUMIN 4.4 08/15/2020   LABGLOB 3.5 08/15/2020   AGRATIO 1.3 08/15/2020   BILITOT 0.4 08/15/2020   ALKPHOS 107 08/15/2020   AST 33 08/15/2020   ALT 31  08/15/2020   Last hemoglobin A1c Lab Results  Component Value Date   HGBA1C 6.3 (H) 09/15/2023      Assessment & Plan:   Problem List Items Addressed This Visit       Other   Prediabetes - Primary    Patient with Hgb A1C 6.3% 09/15/23 in the prediabetes range. Prior value from 3 years ago 6.0%. Family history of T2DM. Patient has  been making positive changes to her lifestyle including increased activity level, increased water intake, and a more balance diet. Will try to limit salt intake. Seems to be losing weight which she is happy about.   BP today 115/72, HR WNL, physical exam unremarkable. Patient will continue to implement these lifestyle modifications and will follow up in 6 months or sooner as needed.      Healthcare maintenance    Pap smear September 2024 with ASCUS, per patient needs to repeat in 1 year. Will monitor.   Mammogram September 2024 normal, will repeat in 1 year.   FIT September 2024 normal, will repeat in 1 year.   Will defer flu vaccine until patient has insurance coverage. Also let her know about possibly cheaper or free options through a local pharmacy or health department.   Patient indicated on PHQ-9 feeling low energy, low appetite, feeling down, and having little interest in things for a total score of 12, but when asked to expand patient did not indicate any concerns. Will monitor.       Encounter for screening involving social determinants of health (SDoH)    Patient currently not insured, given application for [employer]. Patient acknowledged understanding of the instructions and will try to get the application sent in as soon as possible. Patient does not have concerns today, no emergent labs or procedures needed at this time, will defer other screening labs for now (e.g., CMP, CBC, TSH, HIV, Hep C, etc.).       Return in about 6 months (around 04/12/2024) for pre diabetes, anemia f/u .  Patient seen with Dr. Mayford Knife.   Lynn Haldeman Colbert Coyer, MD

## 2023-10-15 NOTE — Assessment & Plan Note (Addendum)
Patient with Hgb A1C 6.3% 09/15/23 in the prediabetes range. Prior value from 3 years ago 6.0%. Family history of T2DM. Patient has been making positive changes to her lifestyle including increased activity level, increased water intake, and a more balance diet. Will try to limit salt intake. Seems to be losing weight which she is happy about.   BP today 115/72, HR WNL, physical exam unremarkable. Patient will continue to implement these lifestyle modifications and will follow up in 6 months or sooner as needed.

## 2023-10-15 NOTE — Assessment & Plan Note (Addendum)
Pap smear September 2024 with ASCUS, per patient needs to repeat in 1 year. Will monitor.   Mammogram September 2024 normal, will repeat in 1 year.   FIT September 2024 normal, will repeat in 1 year.   Will defer flu vaccine until patient has insurance coverage. Also let her know about possibly cheaper or free options through a local pharmacy or health department.   Patient indicated on PHQ-9 feeling low energy, low appetite, feeling down, and having little interest in things for a total score of 12, but when asked to expand patient did not indicate any concerns. Will monitor.

## 2023-10-15 NOTE — Assessment & Plan Note (Addendum)
Patient currently not insured, given application for [employer]. Patient acknowledged understanding of the instructions and will try to get the application sent in as soon as possible. Patient does not have concerns today, no emergent labs or procedures needed at this time, will defer other screening labs for now (e.g., CMP, CBC, TSH, HIV, Hep C, etc.).

## 2023-10-19 NOTE — Telephone Encounter (Signed)
Using [facility] Interpreter, Viviana Simpler, I have spoken with the pt and advised of her PAP results. I have also scheduled her for a COLPO on 11/02/23 at 12:30pm. Pt did not have any questions regarding her results or the procedure. She understands to arrive early as well as out address.

## 2023-10-25 NOTE — Progress Notes (Signed)
Internal Medicine Clinic Attending  I was physically present during the key portions of the resident provided service and participated in the medical decision making of patient's management care. I reviewed pertinent patient test results.  The assessment, diagnosis, and plan were formulated together and I agree with the documentation in the resident's note.  Williams, Julie Anne, MD  

## 2023-11-02 ENCOUNTER — Other Ambulatory Visit (HOSPITAL_COMMUNITY)
Admission: RE | Admit: 2023-11-02 | Discharge: 2023-11-02 | Disposition: A | Discharge status: Home / Self Care | Payer: Self-pay | Source: Ambulatory Visit | Attending: Obstetrics and Gynecology | Admitting: Obstetrics and Gynecology

## 2023-11-02 ENCOUNTER — Ambulatory Visit: Payer: Self-pay | Admitting: Hematology and Oncology

## 2023-11-02 DIAGNOSIS — R87820 Cervical low risk human papillomavirus (HPV) DNA test positive: Secondary | ICD-10-CM

## 2023-11-02 DIAGNOSIS — R87619 Unspecified abnormal cytological findings in specimens from cervix uteri: Secondary | ICD-10-CM

## 2023-11-02 NOTE — Progress Notes (Signed)
[facility] Community Outreach   GYNECOLOGY CLINIC COLPOSCOPY PROCEDURE NOTE  [name] is a 57 y.o. G4P0 here for colposcopy for ASCUS with POSITIVE high risk HPV pap smear on 09/09/2023. Discussed role for HPV in cervical dysplasia, need for surveillance.  Patient given informed consent, signed copy in the chart, time out was performed.  Placed in lithotomy position. Cervix viewed with speculum and colposcope after application of acetic acid.   Colposcopy adequate? Yes  no visible lesions.  ECC specimen obtained. All specimens were labelled and sent to pathology.  Patient was given post procedure instructions.  Will follow up pathology and manage accordingly.  Routine preventative health maintenance measures emphasized.   Pascal Lux, NP 11/02/2023 12:34 PM

## 2023-11-08 LAB — SURGICAL PATHOLOGY

## 2024-07-04 ENCOUNTER — Telehealth: Payer: Self-pay

## 2024-07-04 NOTE — Telephone Encounter (Signed)
 Health Coaching 3  interpreter- [employer]   Goals- Patient has been working on continued lifestyle changes. Patient tries to walk daily for at least 30 minutes. Patient has been practicing a heart healthy diet. Patient has been eating less fried and fatty foods. Patient has tried to increase her lean protein intake and has been eating more chicken. Patient thinks that she has also lost weight since making these changes. Encouraged patient to keep up the good work with lifestyle modifications.       Navigation:  Patient is aware of a follow up session. Patient is scheduled for FU on 08/02/24.

## 2024-08-01 NOTE — Progress Notes (Signed)
 [facility] follow up   Interpreter: Bernice Angry, [employer]  Clinical Measurement:   Vitals:   08/02/24 1042 08/02/24 1348  BP: 118/82 122/82      Medical History: Patient states that she does not know if she has high cholesterol, does not know if she has high blood pressure and she does not know if she has diabetes. Patient states that she does not have history of gestational hypertension, does not have history of pre-eclampsia/eclampsia and she does not have history of gestational diabetes.     Medications: Patient states that she does not take medication to lower cholesterol, blood pressure and blood sugar.  Patient does not take an aspirin a day to help prevent a heart attack or stroke.   Blood pressure, self measurement: Patient states that she does not measure blood pressure from home. She checks her blood pressure N/A. She shares her readings with a health care provider: N/A.   Nutrition: Patient states that on average she eats 2 cups of fruit and 1 cups of vegetables per day. Patient states that she does not eat fish at least 2 times per week. Patient eats less than half servings of whole grains. Patient drinks less than 36 ounces of beverages with added sugar weekly: yes. Patient is currently watching sodium or salt intake: yes. In the past 7 days patient has had 0 drinks containing alcohol. On average patient drinks 0 drinks containing alcohol per day.      Physical activity: Patient states that she gets 90 minutes of moderate and 0 minutes of vigorous physical activity each week.  Smoking status: Patient states that she has has never smoked .   Quality of life: Over the past 2 weeks patient states that she had little interest or pleasure in doing things: several days. She has been feeling down, depressed or hopeless:several days.   Social Determinants of Health Assessment:   Computer Use: During the last 12 months patient states that she has used any of the following:  desktop/laptop, smart phone or tablet/other portable wireless computer: yes.   Internet Use: During the last 12 months, did you or any member of your household have access to the internet: Yes, by paying a cell phone company or internet service provider.   Food Insecurities: During the last 12 months, where there any times when you were worried that you would run out of food because of a lack of money or other resources: No.   Transportation Barriers: During the last 12 months, have you missed a doctor's appointment because of transportation problems: No.   Childcare Barriers: If you are currently using childcare services, please identify  the type of services you use. (If not using childcare services, please select Not applicable): not applicable. During the last 12 months, have you had any barriers to childcare services such as: not applicable.   Housing: What is your housing situation today: I have housing.   Intimate Partner Violence: During the last 12 months, how often did your partner physically hurt you: never. During the last 12 months, how often did your partner insult you or talk down to you: sometimes.  Medication Adherence: During the last 12 months, did you ever forget to take your medicine: not applicable. During the last 12 months, were you careless ar times about taking your medicine: not applicable. During the last 12 months, when you felt better did you sometimes stop taking your medication: not applicable. During the last 12 months, sometimes if you felt worse when  you took your medicine did you stop taking it: not applicable.    Risk reduction and counseling:     Navigation: This was the  follow up session for this patient, I will check up on her progress in the coming months. Will refer patient to [employer] program. Gave patient financial assistance and orange card applications.

## 2024-08-02 ENCOUNTER — Ambulatory Visit: Payer: Self-pay

## 2024-08-02 ENCOUNTER — Other Ambulatory Visit: Payer: Self-pay

## 2024-08-02 VITALS — BP 133/71 | HR 69 | Temp 97.9°F | Ht 59.0 in | Wt 126.0 lb

## 2024-08-02 VITALS — BP 122/82 | Ht 59.0 in | Wt 124.7 lb

## 2024-08-02 DIAGNOSIS — Z1231 Encounter for screening mammogram for malignant neoplasm of breast: Secondary | ICD-10-CM

## 2024-08-02 DIAGNOSIS — Z0000 Encounter for general adult medical examination without abnormal findings: Secondary | ICD-10-CM

## 2024-08-02 DIAGNOSIS — K0889 Other specified disorders of teeth and supporting structures: Secondary | ICD-10-CM

## 2024-08-02 DIAGNOSIS — Z139 Encounter for screening, unspecified: Secondary | ICD-10-CM

## 2024-08-02 MED ORDER — ACETAMINOPHEN 500 MG PO TABS
1000.0000 mg | ORAL_TABLET | Freq: Three times a day (TID) | ORAL | 2 refills | Status: AC | PRN
Start: 2024-08-02 — End: 2025-08-02

## 2024-08-02 MED ORDER — IBUPROFEN 400 MG PO TABS: 400.0000 mg | ORAL_TABLET | Freq: Four times a day (QID) | ORAL | 0 refills | Status: AC | PRN

## 2024-08-02 NOTE — Assessment & Plan Note (Signed)
 Patient is completing her financial assistance paperwork for [facility] card.  She will reach out to [facility] if she has any questions.  Patient to submit the paperwork as soon as possible.  Patient did not want to get her A1c or lipid panel checked today as she has an appointment with [facility] program on October 1 and mentioned she would follow-up with them. --Financial assistance paperwork pending --Patient to follow-up with [facility] program for A1c and lipid panel check --Will call patient regarding dental care options after talking to [facility]

## 2024-08-02 NOTE — Patient Instructions (Addendum)
 Please follow directions as discussed in today's plan: --Take 1000 mg Tylenol  every 8 hours as needed for pain.  Do not exceed more than 4000 mg/day --Take ibuprofen  400 mg every 6 hours as needed for pain.  Do not take it for more than 2 weeks. --We will contact our social worker Ms. Renda Pontes regarding dental referral for uninsured patients --Please give us  a call if your tooth looks infected with swelling, fever.  Thank you for seeing us  today!  Sincerely, Dr. Edgardo

## 2024-08-02 NOTE — Progress Notes (Signed)
 CC: Tooth pain  HPI:  [name] is a 58 y.o. female living with a history stated below and presents today for an acute visit of tooth pain.  Patient said her tooth on the right side lower jaw has been hurting for the past 3 weeks.  She says the pain is usually 8/10 in the morning and was around 4-5/10 at present.  Pain is sharp in nature and occurs when she is eating or talking.  It often feels like someone is picking on her tooth. She has tried taking 2 tablets of 500 mg Tylenol  however that has not provided her relief.  Patient had seen a dentist long time ago, but that provider does not have his practice anymore.  Patient denies any fever, chills, headaches, chest pain, shortness of breath.  She does mention having pain in her right jaw on either side and also mentions feeling nodules close to her ear.  Past Medical History:  Diagnosis Date   Hypertension     Current Outpatient Medications on File Prior to Visit  Medication Sig Dispense Refill   ibuprofen  (ADVIL ) 800 MG tablet Take 1 tablet (800 mg total) by mouth 3 (three) times daily. (Patient not taking: Reported on 09/09/2023) 21 tablet 0   No current facility-administered medications on file prior to visit.    Family History  Problem Relation Age of Onset   Diabetes Mother    Diabetes Father    Diabetes Sister    Cancer Paternal Grandmother     Social History   Socioeconomic History   Marital status: Married    Spouse name: Not on file   Number of children: 4   Years of education: Not on file   Highest education level: 9th grade  Occupational History   Occupation: housewife  Tobacco Use   Smoking status: Never   Smokeless tobacco: Never  Vaping Use   Vaping status: Never Used  Substance and Sexual Activity   Alcohol use: No   Drug use: No   Sexual activity: Not Currently    Birth control/protection: Post-menopausal  Other Topics Concern   Not on file  Social History Narrative   Not on file    Social Drivers of Health   Financial Resource Strain: Not on file  Food Insecurity: No Food Insecurity (09/09/2023)   Hunger Vital Sign    Worried About Running Out of Food in the Last Year: Never true    Ran Out of Food in the Last Year: Never true  Transportation Needs: No Transportation Needs (09/09/2023)   PRAPARE - Administrator, Civil Service (Medical): No    Lack of Transportation (Non-Medical): No  Physical Activity: Not on file  Stress: Not on file  Social Connections: Not on file  Intimate Partner Violence: Not on file    Review of Systems: ROS  All pertinent review of systems in HPI, assessment, plan Vitals:   08/02/24 1415  BP: 133/71  Pulse: 69  Temp: 97.9 F (36.6 C)  TempSrc: Oral  SpO2: 97%  Weight: 126 lb (57.2 kg)  Height: 4' 11 (1.499 m)    Physical Exam: Physical Exam HENT:     Head: Normocephalic.     Comments: No palpable preauricular, postauricular, supraclavicular lymph nodes noted    Mouth/Throat:     Comments: Pain present in the right most incisor on the lower right jaw.  No swelling, or abscess, or bleeding noted in the right lower gum or around the teeth.  No locking heard with opening or closing of jaw Cardiovascular:     Rate and Rhythm: Normal rate and regular rhythm.  Pulmonary:     Effort: Pulmonary effort is normal.     Breath sounds: Normal breath sounds.  Neurological:     Mental Status: She is alert.      Assessment & Plan:     Patient seen with Dr. Karna  Assessment & Plan Tooth pain Pt has ongoing tooth pain to right incisor for the past 3 weeks.  She mentioned going to her dentist in the past and getting several teeth removed for infection.  However, patient's daughter mentioned that particular provider does not have his practice anymore.  We will reach out to Renda Pontes to obtain information about any dentist in the area providing care for patients with no insurance.  Patient has to take Tylenol  or  ibuprofen  for pain control. --Prescribed Tylenol  1000 mg every 8 hours as needed for pain.  Patient told not to exceed more than 4000 mg/day --Prescribed ibuprofen  400 mg every 6 hours as needed for pain.  Patient told not to take ibuprofen  for more than 2 weeks. --Will call patient regarding dental care options after talking to Renda Pontes Encounter for screening involving social determinants of health [facility]) Patient is completing her financial assistance paperwork for [employer] card.  She will reach out to [facility] if she has any questions.  Patient to submit the paperwork as soon as possible.  Patient did not want to get her A1c or lipid panel checked today as she has an appointment with [facility] program on October 1 and mentioned she would follow-up with them. --Financial assistance paperwork pending --Patient to follow-up with [facility] program for A1c and lipid panel check --Will call patient regarding dental care options after talking to [facility]   No orders of the defined types were placed in this encounter.    Lynn [name], D.O. [facility] Health Internal Medicine, PGY-1 Date 08/02/2024 Time 2:25 PM

## 2024-08-07 NOTE — Progress Notes (Signed)
 Internal Medicine Clinic Attending  I was physically present during the key portions of the resident provided service and participated in the medical decision making of patient's management care. I reviewed pertinent patient test results.  The assessment, diagnosis, and plan were formulated together and I agree with the documentation in the resident's note.  Dickie La, MD

## 2024-08-07 NOTE — Addendum Note (Signed)
 Addended by: KARNA FELLOWS on: 08/07/2024 09:40 AM   Modules accepted: Level of Service

## 2024-09-20 ENCOUNTER — Other Ambulatory Visit: Payer: Self-pay

## 2024-09-20 ENCOUNTER — Inpatient Hospital Stay: Attending: Obstetrics and Gynecology | Admitting: *Deleted

## 2024-09-20 VITALS — BP 132/81 | Ht 59.0 in | Wt 127.0 lb

## 2024-09-20 DIAGNOSIS — Z0000 Encounter for general adult medical examination without abnormal findings: Secondary | ICD-10-CM

## 2024-09-20 NOTE — Progress Notes (Signed)
 [facility] Re-screening   Interpreter- Bernice Angry, [place]   Clinical Measurement:  Vitals:   09/20/24 1001 09/20/24 1018  BP: 119/85 132/81   Fasting Labs Drawn Today, will review with patient when they result.   Medical History: Patient states that she does not know if she has high cholesterol, does not know if she has high blood pressure and she does not have diabetes. Patient states that she does not have history of gestational hypertension, does not have history of pre-eclampsia/eclampsia and she does not have history of gestational diabetes.    Medications: Patient states that she does not take medication to lower cholesterol, blood pressure or blood sugar.  Patient does not take an aspirin a day to help prevent a heart attack or stroke.    Blood pressure, self measurement: Patient states that she does not measure blood pressure from home. She checks her blood pressure N/A. She shares her readings with a health care provider: N/A.   Nutrition: Patient states that on average she eats 1 cups of fruit and 2 cups of vegetables per day. Patient states that she does not eat fish at least 2 times per week. Patient eats about half servings of whole grains. Patient drinks less than 36 ounces of beverages with added sugar weekly: yes. Patient is currently watching sodium or salt intake: yes. In the past 7 days patient has consumed drinks containing alcohol on 0 days. On a day that patient consumes drinks containing alcohol on average 0 drinks are consumed.      Physical activity: Patient states that she gets 90 minutes of physical activity each week.  Smoking status: Patient states that she has has never smoked .   Quality of life: Over the past 2 weeks patient states that she had little interest or pleasure in doing things: several days. She has been feeling down, depressed or hopeless:several days.   Social Determinants of Health Assessment:   Computer Use: During the last 12 months patient  states that she has used any of the following: desktop/laptop, smart phone or tablet/other portable wireless computer: yes.   Internet Use: During the last 12 months, did you or any member of your household have access to the internet: Yes, by paying a cell phone company or internet service provider.   Food Insecurities: During the last 12 months, where there any times when you were worried that you would run out of food because of a lack of money or other resources: No.   Transportation Barriers: During the last 12 months, have you missed a doctor's appointment because of transportation problems: No.   Childcare Barriers: If you are currently using childcare services, please identify  the type of services you use. (If not using childcare services, please select Not applicable): not applicable. During the last 12 months, have you had any barriers to childcare services such as: not applicable.   Housing: What is your housing situation today: I have housing.   Intimate Partner Violence: During the last 12 months, how often did your partner physically hurt you: don't want to answer. During the last 12 months, how often did your partner insult you or talk down to you: don't want to answer.  Medication Adherence: During the last 12 months, did you ever forget to take your medicine: not applicable. During the last 12 months, were you careless ar times about taking your medicine: not applicable. During the last 12 months, when you felt better did you sometimes stop taking your medication: not  applicable. During the last 12 months, sometimes if you felt worse when you took your medicine did you stop taking it: not applicable.   Risk reduction and counseling:   Health Coaching: Spoke with patient about the daily recommendations for fruits and vegetables. Showed patient what a serving size would look like. Patient does not consume fish. Explained how it is part of a heart healthy diet. Gave suggestions of  ones that she can try (salmon, tuna, mackerel, sardines, sea bass or trout). Patient consumes whole wheat bread and oatmeal regularly. Patient has been walking 2-3 days per week for 30 minutes.  Goal: Patient did not set a goal during initial screening. Will follow-up again during next health coaching session.   Navigation:  I will notify patient of lab results.  Patient is aware of 2 more health coaching sessions and a follow up. Referred patient to Internal Medicine for FU. Referred patient to the [facility] program for counseling services.

## 2024-09-21 LAB — LIPID PANEL
Chol/HDL Ratio: 4.3 ratio (ref 0.0–4.4)
Cholesterol, Total: 191 mg/dL (ref 100–199)
HDL: 44 mg/dL (ref >39)
LDL Chol Calc (NIH): 123 mg/dL — ABNORMAL HIGH (ref 0–99)
Triglycerides: 131 mg/dL (ref 0–149)
VLDL Cholesterol Cal: 24 mg/dL (ref 5–40)

## 2024-09-21 LAB — GLUCOSE, RANDOM: Glucose: 112 mg/dL — ABNORMAL HIGH (ref 70–99)

## 2024-09-21 LAB — HEMOGLOBIN A1C
Est. average glucose Bld gHb Est-mCnc: 128 mg/dL
Hgb A1c MFr Bld: 6.1 % — ABNORMAL HIGH (ref 4.8–5.6)

## 2024-09-27 ENCOUNTER — Ambulatory Visit: Payer: Self-pay

## 2024-09-27 NOTE — Telephone Encounter (Signed)
 Health coaching 2   interpreter- Bernice Angry, [facility]   Labs- 191 cholesterol, 123 LDL cholesterol, 44 HDL cholesterol, 131 triglycerides, 6.1 hemoglobin A1C, 112 mean plasma glucose. Patient understands and is aware of her lab results.   Goals-  1. Watch the amount of fried foods consumed.  2. Watch the amount of red meats consumed. Increase lean protein intake (chicken, fish and [place]). 3. Look for low fat or reduced fat dairy options. 4. Watch the amount of sweet and sugary foods and drinks consumed.  5. Watch the amount of carb rich foods consumed.  6. Daily exercise for 20-30 minutes.    Navigation:  Patient is aware of 1 more health coaching sessions and a follow up. Patient is scheduled for FU with Internal Medicine on Monday, October 13 @ 10:15 am.

## 2024-10-02 ENCOUNTER — Other Ambulatory Visit: Payer: Self-pay

## 2024-10-02 ENCOUNTER — Ambulatory Visit (INDEPENDENT_AMBULATORY_CARE_PROVIDER_SITE_OTHER): Payer: Self-pay | Admitting: Student

## 2024-10-02 ENCOUNTER — Encounter: Payer: Self-pay | Admitting: Student

## 2024-10-02 VITALS — BP 111/73 | HR 64 | Temp 97.8°F | Ht 59.0 in | Wt 129.2 lb

## 2024-10-02 DIAGNOSIS — E785 Hyperlipidemia, unspecified: Secondary | ICD-10-CM

## 2024-10-02 DIAGNOSIS — Z0000 Encounter for general adult medical examination without abnormal findings: Secondary | ICD-10-CM

## 2024-10-02 DIAGNOSIS — Z833 Family history of diabetes mellitus: Secondary | ICD-10-CM

## 2024-10-02 DIAGNOSIS — R7303 Prediabetes: Secondary | ICD-10-CM

## 2024-10-02 NOTE — Assessment & Plan Note (Signed)
 Remains in prediabetic range for many years. Overall I think she does a good job with her lifestyle and her disease is not progressing. Reviewed lifestyle factors.

## 2024-10-02 NOTE — Assessment & Plan Note (Signed)
 LDL above 100 but no other indications for lipid lowering therapy. ASCVD risk is 2.3%. Continued monitoring reasonable, no indication for statin. Lifestyle changes per above and modifications recommended.

## 2024-10-02 NOTE — Assessment & Plan Note (Signed)
 Recently bought a treadmill and gets 30 minutes in per day.  She is in touch with financial assistance and is in a program to cover 50% of her further medical expenses beyond [facility] program. Can consider inexpensive options for health maintenance in the future.

## 2024-10-02 NOTE — Progress Notes (Signed)
   CC: ref Delsie woman for elevated A1c and LDL  HPI:  [name] is a 58 y.o. female with a PMH stated below who presents today for lab result evaluation.  Please see problem based assessment and plan for additional details.  Past Medical History:  Diagnosis Date   Hypertension     Review of Systems: ROS negative except for what is noted on the assessment and plan.  Vitals:   10/02/24 1004  BP: 111/73  Pulse: 64  Temp: 97.8 F (36.6 C)  TempSrc: Oral  SpO2: 100%  Weight: 129 lb 3.2 oz (58.6 kg)  Height: 4' 11 (1.499 m)    Physical Exam: Constitutional: well-appearing woman in no acute distress Cardiovascular: regular rate and rhythm, no m/r/g Pulmonary/Chest: normal work of breathing on room air, lungs clear to auscultation bilaterally MSK: normal bulk and tone Skin: warm and dry  Assessment & Plan:   Patient discussed with Dr. Lovie  Prediabetes Remains in prediabetic range for many years. Overall I think she does a good job with her lifestyle and her disease is not progressing. Reviewed lifestyle factors.  Hyperlipidemia LDL above 100 but no other indications for lipid lowering therapy. ASCVD risk is 2.3%. Continued monitoring reasonable, no indication for statin. Lifestyle changes per above and modifications recommended.  Healthcare maintenance Recently bought a treadmill and gets 30 minutes in per day.  She is in touch with financial assistance and is in a program to cover 50% of her further medical expenses beyond wise woman program. Can consider inexpensive options for health maintenance in the future.  Lonni Africa, D.O. [facility] Internal Medicine, PGY-2 Phone: [phone] Date 10/02/2024 Time 3:36 PM

## 2024-10-03 NOTE — Progress Notes (Signed)
 Internal Medicine Clinic Attending  Case discussed with the resident at the time of the visit.  We reviewed the resident's history and exam and pertinent patient test results.  I agree with the assessment, diagnosis, and plan of care documented in the resident's note.

## 2024-11-02 ENCOUNTER — Encounter

## 2024-11-02 ENCOUNTER — Ambulatory Visit

## 2024-12-07 ENCOUNTER — Encounter

## 2024-12-07 ENCOUNTER — Ambulatory Visit: Payer: Self-pay | Admitting: *Deleted

## 2024-12-07 VITALS — BP 140/80 | Wt 124.0 lb

## 2024-12-07 DIAGNOSIS — Z01419 Encounter for gynecological examination (general) (routine) without abnormal findings: Secondary | ICD-10-CM

## 2024-12-07 DIAGNOSIS — Z1211 Encounter for screening for malignant neoplasm of colon: Secondary | ICD-10-CM

## 2024-12-07 NOTE — Patient Instructions (Signed)
 Explained breast self awareness with Lynn [name]. Pap smear completed today. Let patient know if today's Pap smear is normal and HPV negative that her next Pap smear is due in one year. Referred patient to [facility] for a screening mammogram on mobile unit. Appointment scheduled Thursday, December 28, 2024 at 0900. Patient aware of appointment and will be there. Let patient know will follow up with her within the next couple weeks with results of her mammogram by letter or phone. Lynn [name] verbalized understanding.  Annaleia Pence, Wanda Ship, RN 9:24 AM

## 2024-12-07 NOTE — Progress Notes (Signed)
 Lynn [name] is a 58 y.o. G4P0 female who presents to [facility] clinic today with no complaints.    Pap Smear: Pap smear completed today. 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 Per patient has no history of an abnormal Pap smear prior to her most recent Pap smear. Last Pap smear result is available in Epic.   Physical exam: Breasts Breasts symmetrical. No skin abnormalities bilateral breasts. No nipple retraction bilateral breasts. No nipple discharge bilateral breasts. No lymphadenopathy. No lumps palpated bilateral breasts. No complaints of pain or tenderness on exam.      MS 3D SCR MAMMO BILAT BR (aka MM) Result Date: 09/10/2023 CLINICAL DATA:  Screening. EXAM: DIGITAL SCREENING BILATERAL MAMMOGRAM WITH TOMOSYNTHESIS AND CAD TECHNIQUE: Bilateral screening digital craniocaudal and mediolateral oblique mammograms were obtained. Bilateral screening digital breast tomosynthesis was performed. The images were evaluated with computer-aided detection. COMPARISON:  Previous exam(s). ACR Breast Density Category c: The breasts are heterogeneously dense, which may obscure small masses. FINDINGS: There are no findings suspicious for malignancy. IMPRESSION: No mammographic evidence of malignancy. A result letter of this screening mammogram will be mailed directly to the patient. RECOMMENDATION: Screening mammogram in one year. (Code:SM-B-01Y) BI-RADS CATEGORY  1: Negative. Electronically Signed   By: Lynn [name] M.D.   On: 09/10/2023 16:11    Pelvic/Bimanual Ext Genitalia No lesions, no swelling and no discharge observed on external genitalia.        Vagina Vagina pink and normal texture. No lesions or discharge observed in vagina.        Cervix Cervix is present. Cervix pink and of normal texture. Cervix friable. No discharge observed.    Uterus Uterus is present and  palpable. Uterus in normal position and normal size.        Adnexae Bilateral ovaries present and palpable. No tenderness on palpation.         Rectovaginal No rectal exam completed today since patient had no rectal complaints. No skin abnormalities observed on exam.     Smoking History: Patient has never smoked.   Patient Navigation: Patient education provided. Access to services provided for patient through [place] program. Spanish interpreter Lynn [name] from [facility] provided.   Colorectal Cancer Screening: Per patient has never had a colonoscopy. No complaints today.   Breast and Cervical Cancer Risk Assessment: Patient does not have family history of breast cancer, known genetic mutations, or radiation treatment to the chest before age 75. Patient has history of cervical dysplasia. Patient has no history of being immunocompromised or DES exposure in-utero.  Risk Scores as of Encounter on 12/07/2024     [name]           5-year 1.35%   Lifetime 7.78%   This patient is [place]/Latina but has no documented birth country, so the [place] model used data from [place] patients to calculate their risk score. Document a birth country in the Demographics activity for a more accurate score.         Last calculated by Lynn [name], CMA on 12/07/2024 at  9:08 AM        A: BCCCP exam with pap smear No complaints.  P: Referred patient to [facility] for a screening mammogram on mobile unit. Appointment scheduled Thursday, December 28, 2024 at 0900.  Lynn Wanda SQUIBB, RN 12/07/2024 9:24 AM

## 2024-12-15 ENCOUNTER — Ambulatory Visit: Payer: Self-pay | Admitting: *Deleted

## 2024-12-15 LAB — CYTOLOGY - PAP
Comment: NEGATIVE
Comment: NEGATIVE
Comment: NEGATIVE
HPV 16: NEGATIVE
HPV 18 / 45: NEGATIVE
High risk HPV: POSITIVE — AB

## 2024-12-15 NOTE — Progress Notes (Signed)
   Needs colpo

## 2024-12-16 LAB — FECAL OCCULT BLOOD, IMMUNOCHEMICAL: Fecal Occult Bld: NEGATIVE

## 2024-12-28 ENCOUNTER — Ambulatory Visit
Admission: RE | Admit: 2024-12-28 | Discharge: 2024-12-28 | Disposition: A | Discharge status: Home / Self Care | Source: Ambulatory Visit | Attending: Obstetrics and Gynecology | Admitting: Obstetrics and Gynecology

## 2024-12-28 DIAGNOSIS — Z1231 Encounter for screening mammogram for malignant neoplasm of breast: Secondary | ICD-10-CM
